# Patient Record
Sex: Female | Born: 1980 | Race: White | Hispanic: No | Marital: Married | Smoking: Never smoker
Health system: Southern US, Community
[De-identification: ages and names within clinical notes are randomized; demographics above are authoritative.]

## PROBLEM LIST (undated history)

## (undated) DIAGNOSIS — F32A Depression, unspecified: Secondary | ICD-10-CM

## (undated) DIAGNOSIS — F419 Anxiety disorder, unspecified: Secondary | ICD-10-CM

## (undated) DIAGNOSIS — J45909 Unspecified asthma, uncomplicated: Secondary | ICD-10-CM

## (undated) DIAGNOSIS — C801 Malignant (primary) neoplasm, unspecified: Secondary | ICD-10-CM

## (undated) DIAGNOSIS — G4700 Insomnia, unspecified: Secondary | ICD-10-CM

## (undated) DIAGNOSIS — T7840XA Allergy, unspecified, initial encounter: Secondary | ICD-10-CM

## (undated) DIAGNOSIS — E079 Disorder of thyroid, unspecified: Secondary | ICD-10-CM

## (undated) DIAGNOSIS — G43909 Migraine, unspecified, not intractable, without status migrainosus: Secondary | ICD-10-CM

## (undated) HISTORY — PX: STOMACH SURGERY: SHX791

## (undated) HISTORY — DX: Depression, unspecified: F32.A

## (undated) HISTORY — DX: Migraine, unspecified, not intractable, without status migrainosus: G43.909

## (undated) HISTORY — DX: Insomnia, unspecified: G47.00

## (undated) HISTORY — PX: ABDOMINAL HYSTERECTOMY: SHX81

## (undated) HISTORY — DX: Anxiety disorder, unspecified: F41.9

## (undated) HISTORY — DX: Allergy, unspecified, initial encounter: T78.40XA

## (undated) HISTORY — PX: NASAL SINUS SURGERY: SHX719

## (undated) HISTORY — DX: Unspecified asthma, uncomplicated: J45.909

## (undated) HISTORY — DX: Disorder of thyroid, unspecified: E07.9

## (undated) HISTORY — PX: THYROIDECTOMY: SHX17

---

## 2001-12-30 ENCOUNTER — Encounter: Payer: Self-pay | Admitting: Obstetrics and Gynecology

## 2001-12-30 ENCOUNTER — Inpatient Hospital Stay (HOSPITAL_COMMUNITY): Admission: AD | Admit: 2001-12-30 | Discharge: 2001-12-31 | Payer: Self-pay | Admitting: Obstetrics and Gynecology

## 2007-07-04 ENCOUNTER — Ambulatory Visit: Payer: Self-pay | Admitting: Internal Medicine

## 2011-04-09 DIAGNOSIS — E890 Postprocedural hypothyroidism: Secondary | ICD-10-CM | POA: Insufficient documentation

## 2011-12-24 DIAGNOSIS — E209 Hypoparathyroidism, unspecified: Secondary | ICD-10-CM | POA: Insufficient documentation

## 2012-09-01 DIAGNOSIS — M7989 Other specified soft tissue disorders: Secondary | ICD-10-CM | POA: Insufficient documentation

## 2017-01-23 DIAGNOSIS — J4520 Mild intermittent asthma, uncomplicated: Secondary | ICD-10-CM | POA: Insufficient documentation

## 2017-01-23 DIAGNOSIS — G43109 Migraine with aura, not intractable, without status migrainosus: Secondary | ICD-10-CM | POA: Insufficient documentation

## 2017-01-23 DIAGNOSIS — K219 Gastro-esophageal reflux disease without esophagitis: Secondary | ICD-10-CM | POA: Insufficient documentation

## 2018-01-16 ENCOUNTER — Other Ambulatory Visit: Payer: Self-pay | Admitting: Family Medicine

## 2018-01-16 DIAGNOSIS — Z1231 Encounter for screening mammogram for malignant neoplasm of breast: Secondary | ICD-10-CM

## 2018-01-28 ENCOUNTER — Ambulatory Visit
Admission: RE | Admit: 2018-01-28 | Discharge: 2018-01-28 | Disposition: A | Discharge status: Home / Self Care | Payer: BLUE CROSS/BLUE SHIELD | Source: Ambulatory Visit | Attending: Family Medicine | Admitting: Family Medicine

## 2018-01-28 ENCOUNTER — Encounter (INDEPENDENT_AMBULATORY_CARE_PROVIDER_SITE_OTHER): Payer: Self-pay

## 2018-01-28 DIAGNOSIS — Z1231 Encounter for screening mammogram for malignant neoplasm of breast: Secondary | ICD-10-CM

## 2018-01-28 HISTORY — DX: Malignant (primary) neoplasm, unspecified: C80.1

## 2018-09-29 ENCOUNTER — Other Ambulatory Visit: Payer: Self-pay | Admitting: Sports Medicine

## 2018-09-29 DIAGNOSIS — M7542 Impingement syndrome of left shoulder: Secondary | ICD-10-CM

## 2018-09-29 DIAGNOSIS — M25512 Pain in left shoulder: Secondary | ICD-10-CM

## 2018-09-29 DIAGNOSIS — G8929 Other chronic pain: Secondary | ICD-10-CM

## 2018-09-29 DIAGNOSIS — M778 Other enthesopathies, not elsewhere classified: Secondary | ICD-10-CM

## 2018-10-13 ENCOUNTER — Ambulatory Visit
Admission: RE | Admit: 2018-10-13 | Discharge: 2018-10-13 | Disposition: A | Discharge status: Home / Self Care | Payer: BC Managed Care – PPO | Source: Ambulatory Visit | Attending: Sports Medicine | Admitting: Sports Medicine

## 2018-10-13 DIAGNOSIS — M7542 Impingement syndrome of left shoulder: Secondary | ICD-10-CM | POA: Diagnosis present

## 2018-10-13 DIAGNOSIS — M25512 Pain in left shoulder: Secondary | ICD-10-CM | POA: Diagnosis not present

## 2018-10-13 DIAGNOSIS — G8929 Other chronic pain: Secondary | ICD-10-CM | POA: Diagnosis present

## 2018-10-13 DIAGNOSIS — M778 Other enthesopathies, not elsewhere classified: Secondary | ICD-10-CM

## 2018-10-13 DIAGNOSIS — M7582 Other shoulder lesions, left shoulder: Secondary | ICD-10-CM | POA: Insufficient documentation

## 2019-07-09 ENCOUNTER — Ambulatory Visit: Payer: Self-pay | Attending: Internal Medicine

## 2019-07-09 DIAGNOSIS — Z23 Encounter for immunization: Secondary | ICD-10-CM

## 2019-07-09 NOTE — Progress Notes (Signed)
   Covid-19 Vaccination Clinic  Name:  Erin Cervantes    MRN: [id number] DOB: 1980/12/24  07/09/2019  Erin Cervantes was observed post Covid-19 immunization for 15 minutes without incident. She was provided with Vaccine Information Sheet and instruction to access the V-Safe system.   Erin Cervantes was instructed to call 911 with any severe reactions post vaccine: Marland Kitchen Difficulty breathing  . Swelling of face and throat  . A fast heartbeat  . A bad rash all over body  . Dizziness and weakness   Immunizations Administered    Name Date Dose VIS Date Route   Pfizer COVID-19 Vaccine 07/09/2019  9:17 AM 0.3 mL 03/13/2019 Intramuscular   Manufacturer: [place]   Lot: [id number]   [place]: [id number]

## 2019-08-04 ENCOUNTER — Ambulatory Visit: Payer: Self-pay | Attending: Internal Medicine

## 2019-08-04 DIAGNOSIS — Z23 Encounter for immunization: Secondary | ICD-10-CM

## 2019-08-04 NOTE — Progress Notes (Signed)
   Covid-19 Vaccination Clinic  Name:  Erin Cervantes    MRN: [id number] DOB: April 11, 1980  08/04/2019  Erin Cervantes was observed post Covid-19 immunization for 15 minutes without incident. She was provided with Vaccine Information Sheet and instruction to access the V-Safe system.   Erin Cervantes was instructed to call 911 with any severe reactions post vaccine: Marland Kitchen Difficulty breathing  . Swelling of face and throat  . A fast heartbeat  . A bad rash all over body  . Dizziness and weakness   Immunizations Administered    Name Date Dose VIS Date Route   Pfizer COVID-19 Vaccine 08/04/2019  9:25 AM 0.3 mL 05/27/2018 Intramuscular   Manufacturer: [place]   Lot: [id number]   [place]: [id number]

## 2019-11-27 DIAGNOSIS — G4719 Other hypersomnia: Secondary | ICD-10-CM | POA: Insufficient documentation

## 2020-03-22 LAB — HM HEPATITIS C SCREENING LAB: HM Hepatitis Screen: NEGATIVE

## 2020-05-26 DIAGNOSIS — M25559 Pain in unspecified hip: Secondary | ICD-10-CM | POA: Insufficient documentation

## 2020-11-18 IMAGING — MR MRI OF THE LEFT SHOULDER WITHOUT CONTRAST
5 series · 32 of 40 positions shown · non-contrast
Comparison: None.

CLINICAL DATA: Persistent shoulder pain and limited range of motion
since feeling a pop while reaching for something in [REDACTED].

EXAM:
MRI OF THE LEFT SHOULDER WITHOUT CONTRAST
TECHNIQUE: Multiplanar, multisequence MR imaging of the shoulder was performed.
No intravenous contrast was administered.

[Series 5: PD fat-sat · axial · left · 4.0mm · 0.55mm/px · z∈[-53,+77]mm · 8 of 28 slices shown (1 of 2)]
[im 1/28]
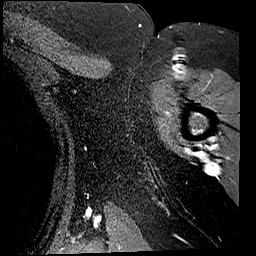
[im 4/28]
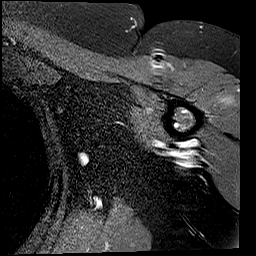
[im 10/28]
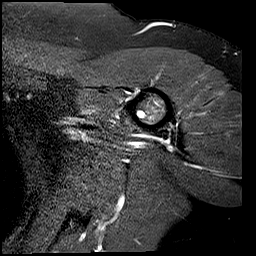
[im 13/28]
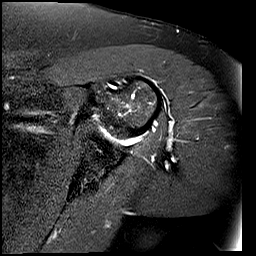
[im 16/28]
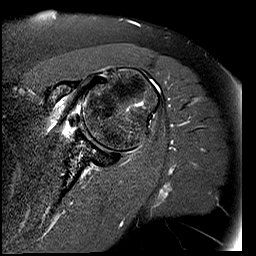
[im 19/28]
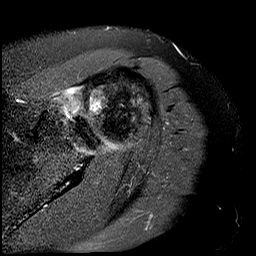
[im 25/28]
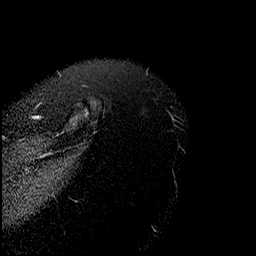
[im 28/28]
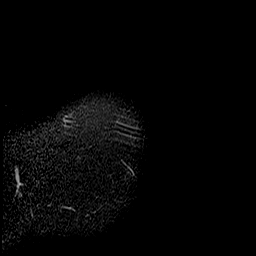

[Series 6: PD fat-sat · oblique · left · 4.0mm · 0.44mm/px · 8 of 26 slices shown (2 of 2)]
[im 1/26]
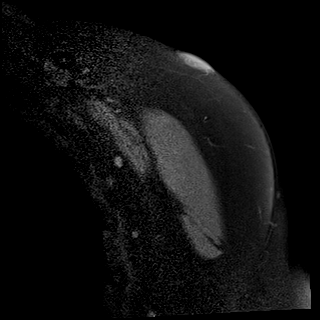
[im 4/26]
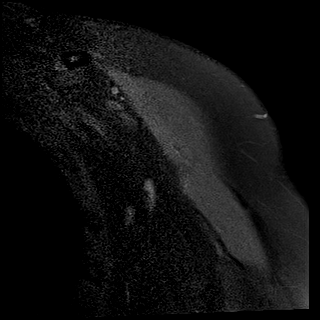
[im 8/26]
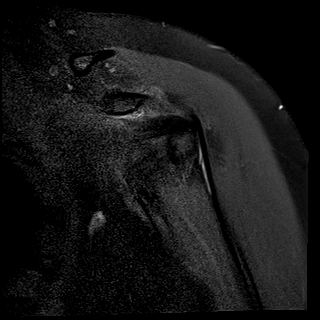
[im 11/26]
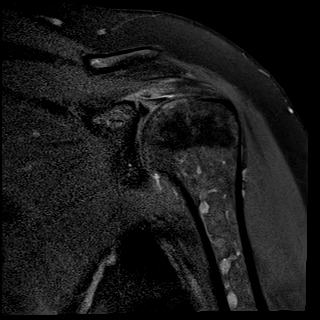
[im 15/26]
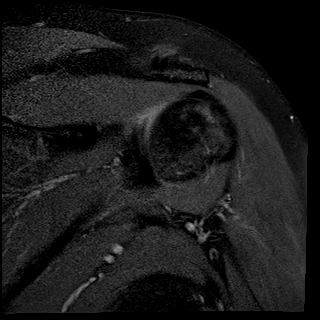
[im 18/26]
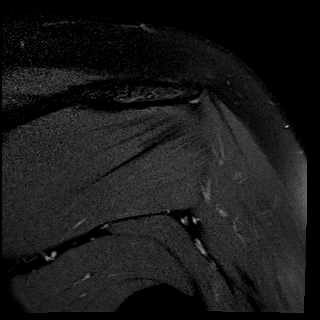
[im 22/26]
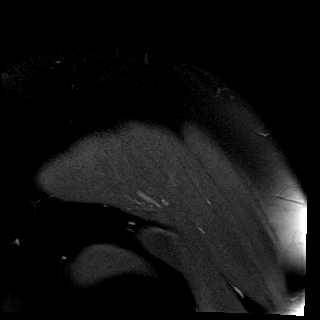
[im 26/26]
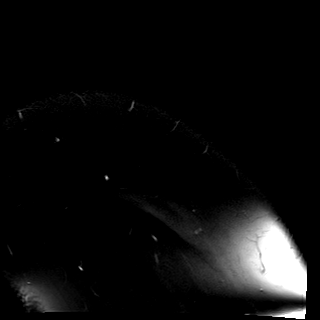

[Series 7: T2 fat-sat · oblique · left · 4.0mm · 0.44mm/px · 8 of 26 slices shown (1 of 2)]
[im 1/26]
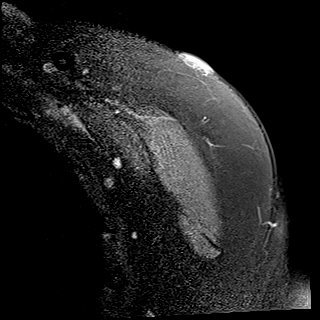
[im 4/26]
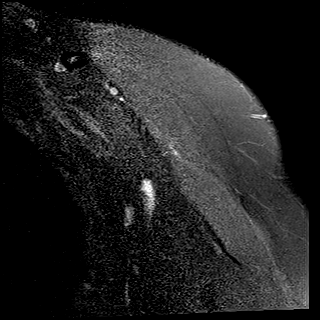
[im 8/26]
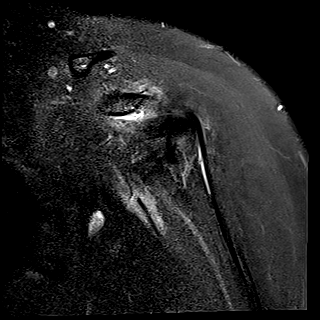
[im 11/26]
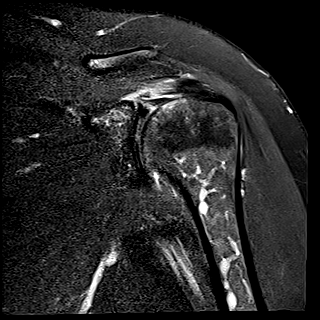
[im 15/26]
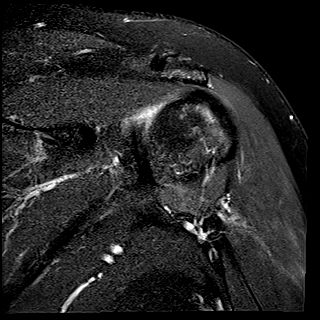
[im 18/26]
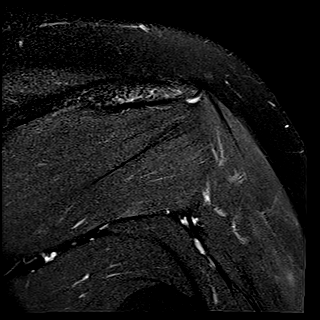
[im 22/26]
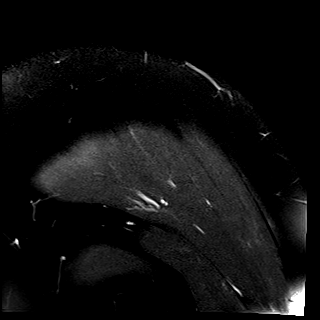
[im 26/26]
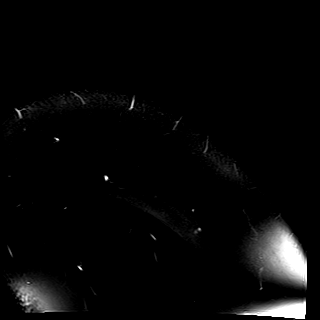

[Series 8: T2 fat-sat · oblique · left · 4.0mm · 0.23mm/px · 7 of 22 slices shown (2 of 2)]
[im 1/22]
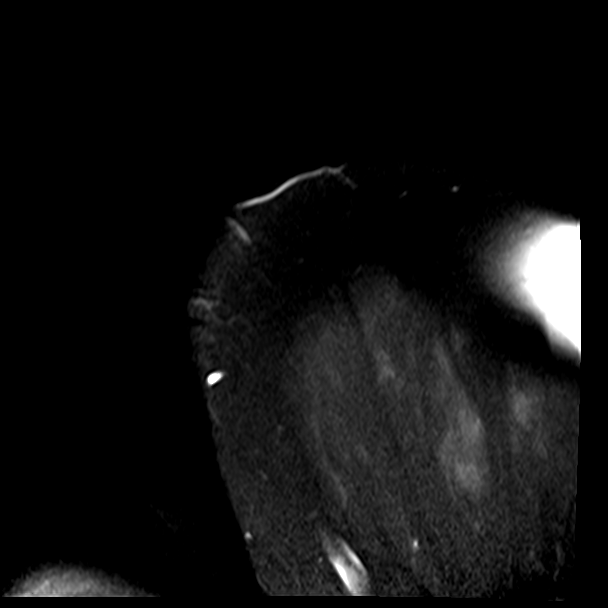
[im 4/22]
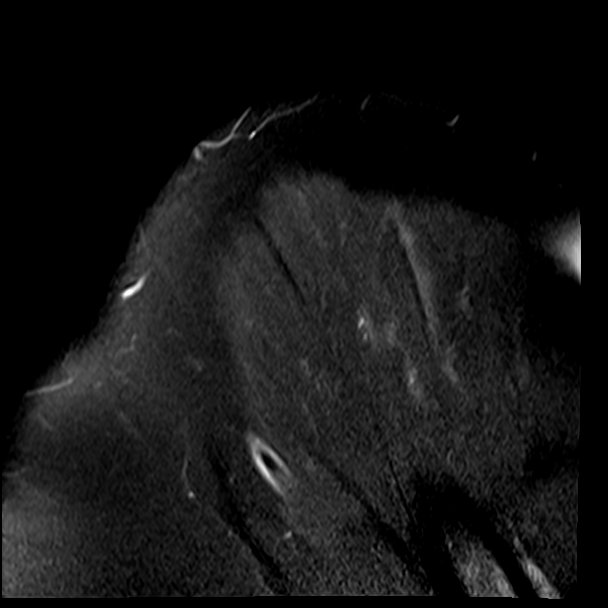
[im 8/22]
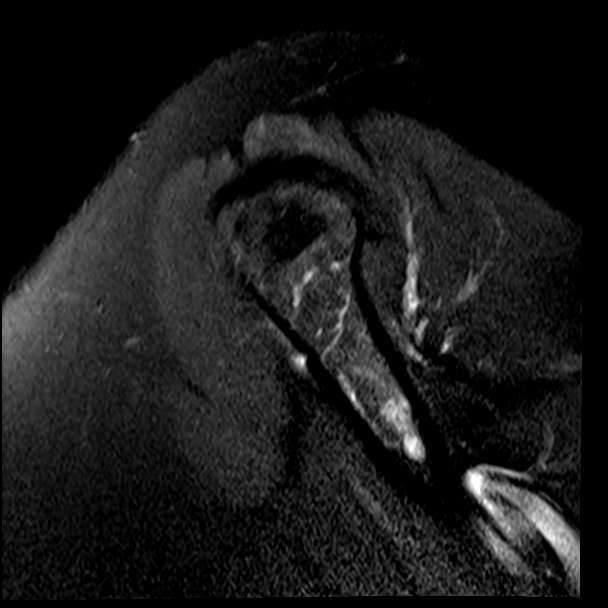
[im 11/22]
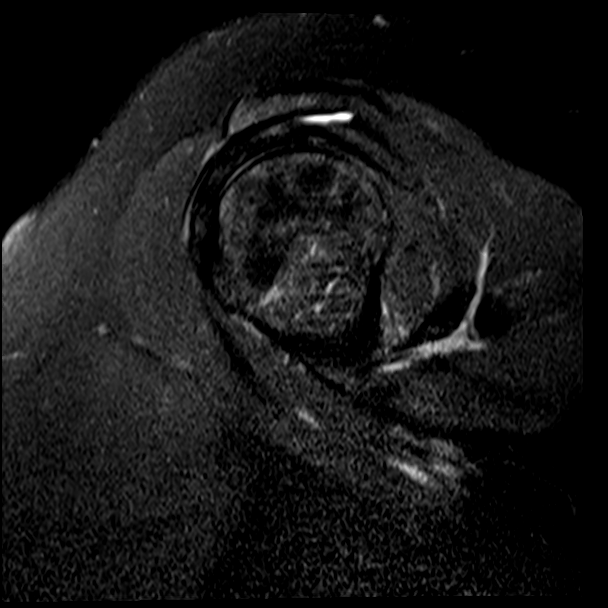
[im 15/22]
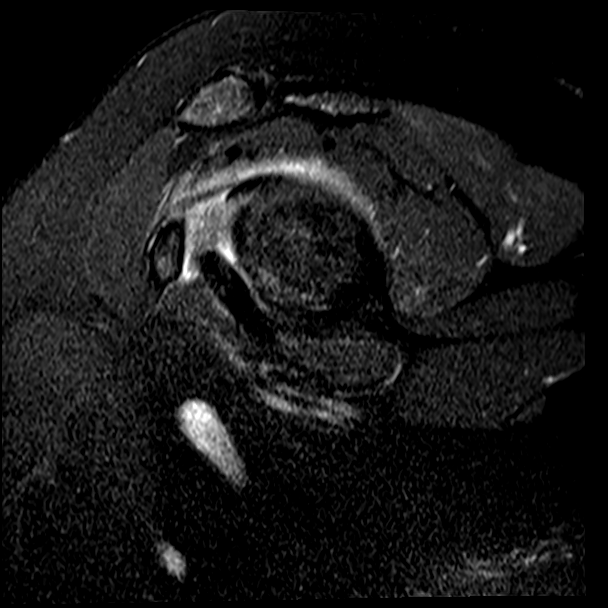
[im 18/22]
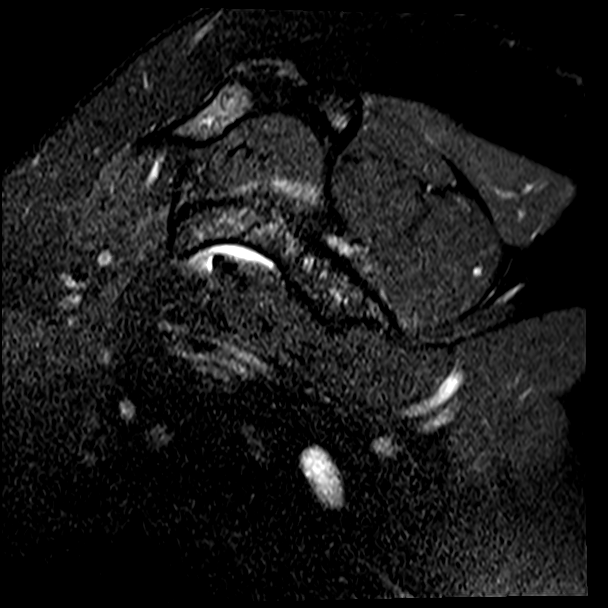
[im 22/22]
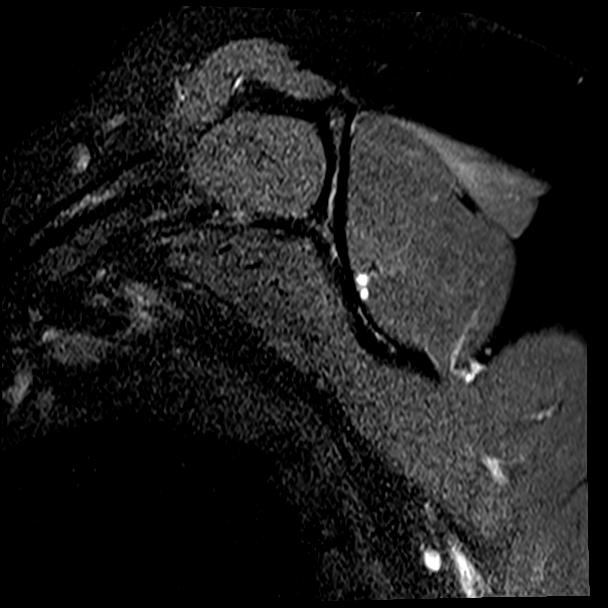

[Series 9: T1 · oblique · left · 4.0mm · 0.36mm/px · 1 of 22 slices shown]
[im 1/22]
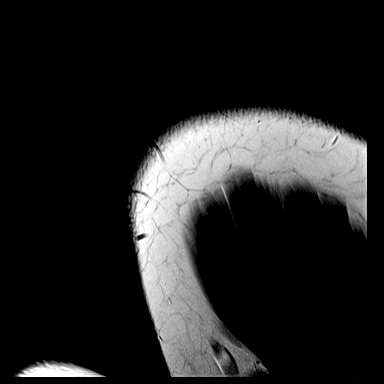

[32 of 40 positions shown; findings below may reference images not displayed]

FINDINGS: Rotator cuff: Intact supraspinatus, infraspinatus, teres minor and
subscapularis tendons.

Muscles:  No atrophy or abnormal signal of the

Muscles of the rotator cuff.

Biceps long head:  Intact and normally positioned.

Acromioclavicular Joint: Minimal arthropathy of the
acromioclavicular joint with tiny marginal osteophytes. Type II
acromion. Trace subacromial/subdeltoid bursal fluid.

Glenohumeral Joint: No joint effusion. No chondral defect.

Labrum: Grossly intact, but evaluation is limited by lack of
intraarticular fluid. Bulbous appearance of the anterior labrum
without discrete tear.

Bones: Small area of focal marrow edema in the anterior superior
humeral head. No acute fracture or dislocation. No suspicious bone
lesion.

Other: Increased T2 signal and loss of the normal fat in the rotator
interval.
IMPRESSION: 1. Small area of focal marrow edema in the anterior superior humeral
head, nonspecific, potentially degenerative or a tiny contusion. No
fracture.
2. Intact rotator cuff.
3. Very mild subacromial/subdeltoid bursitis.
4. Findings which can be seen with adhesive capsulitis.

## 2021-06-20 LAB — HM MAMMOGRAPHY

## 2022-03-15 ENCOUNTER — Encounter: Payer: Self-pay | Admitting: Internal Medicine

## 2022-03-15 ENCOUNTER — Ambulatory Visit: Payer: 59 | Admitting: Internal Medicine

## 2022-03-15 VITALS — BP 124/72 | HR 79 | Temp 97.7°F | Resp 18 | Ht 67.0 in | Wt 225.1 lb

## 2022-03-15 DIAGNOSIS — Z23 Encounter for immunization: Secondary | ICD-10-CM

## 2022-03-15 DIAGNOSIS — J302 Other seasonal allergic rhinitis: Secondary | ICD-10-CM | POA: Diagnosis not present

## 2022-03-15 DIAGNOSIS — F339 Major depressive disorder, recurrent, unspecified: Secondary | ICD-10-CM

## 2022-03-15 DIAGNOSIS — Z13220 Encounter for screening for lipoid disorders: Secondary | ICD-10-CM

## 2022-03-15 DIAGNOSIS — J4520 Mild intermittent asthma, uncomplicated: Secondary | ICD-10-CM | POA: Diagnosis not present

## 2022-03-15 DIAGNOSIS — E890 Postprocedural hypothyroidism: Secondary | ICD-10-CM

## 2022-03-15 DIAGNOSIS — G43909 Migraine, unspecified, not intractable, without status migrainosus: Secondary | ICD-10-CM | POA: Diagnosis not present

## 2022-03-15 DIAGNOSIS — Z114 Encounter for screening for human immunodeficiency virus [HIV]: Secondary | ICD-10-CM

## 2022-03-15 NOTE — Patient Instructions (Signed)
It was great seeing you today!  Plan discussed at today's visit: -Blood work ordered today, results will be uploaded to [facility].  -Flu vaccine today  Follow up in: 6 months or sooner as needed   Take care and let us know if you have any questions or concerns prior to your next visit.  Dr. Rosana Berger

## 2022-03-15 NOTE — Progress Notes (Signed)
New Patient Office Visit  Subjective    Patient ID: Erin Cervantes, female    DOB: 12/14/1980  Age: 41 y.o. MRN: [id number]  CC:  Chief Complaint  Patient presents with   Establish Care    HPI Erin Cervantes presents to establish care.  Asthma:  -Asthma status: stable -Current Treatments: Albuterol PRN -Satisfied with current treatment?: yes -Albuterol/rescue inhaler frequency: rarely - worse with allergies/viral illness  -Dyspnea frequency: occasionally  -Wheezing frequency: no -Cough frequency: no  -Nocturnal symptom frequency: none  -Limitation of activity: no -Current upper respiratory symptoms: no -Pneumovax: unknown -Influenza: Not up to Date  Hypothyroidism/Hypoparathyroidism: -Medications: Levothyroxine 175 mcg, Calcitriol 0.25  -History of goiter, s/p total thyroidectomy in 2009 -Patient is compliant with the above medication (s) at the above dose and reports no medication side effects.  -Denies weight changes, cold./heat intolerance, skin changes, anxiety/palpitations  -Last TSH: 11/22/21 2.397 -Following with Endocrinology at [facility]   MIGRAINES -Following with Neurology -Currently on Ubrelvy 100 mg, Topamax 100 mg  -Now having migraines only about once a month - duration used to be daily prior to Topamax. Roselyn Meier also working well, will abort migraines in about 30 minutes  Duration: chronic  MDD: -Following with Psychiatry at [facility]  -Mood status: stable - having a hard time working from home, feeling isolated -Current treatment: Celexa 40 mg, Trazodone 100 mg, Klonopin PRN -Duration of current treatment : years -Side effects: no Medication compliance: excellent compliance     03/15/2022    3:34 PM  Depression screen PHQ 2/9  Decreased Interest 2  Down, Depressed, Hopeless 2  PHQ - 2 Score 4  Altered sleeping 3  Tired, decreased energy 3  Change in appetite 2  Feeling bad or failure about yourself  1  Trouble concentrating 3   Moving slowly or fidgety/restless 2  Suicidal thoughts 0  PHQ-9 Score 18  Difficult doing work/chores Very difficult   Seasonal Allergies:  -Currently on Allegra  Health Maintenance: -Blood work due -Mammogram 06/20/21 Birads-1  -Flu vaccine due    Outpatient Encounter Medications as of 03/15/2022  Medication Sig   albuterol (VENTOLIN HFA) 108 (90 Base) MCG/ACT inhaler Inhale 2 puffs into the lungs every 6 (six) hours as needed.   calcitRIOL (ROCALTROL) 0.25 MCG capsule Take 0.5 mcg by mouth daily.   citalopram (CELEXA) 40 MG tablet Take 40 mg by mouth daily.   clonazePAM (KLONOPIN) 0.5 MG tablet Take 0.5 mg by mouth 3 (three) times daily.   fexofenadine (ALLEGRA) 180 MG tablet Take 180 mg by mouth daily.   levothyroxine (SYNTHROID) 175 MCG tablet Take 175 mcg by mouth daily.   topiramate (TOPAMAX) 100 MG tablet Take 100 mg by mouth daily.   traZODone (DESYREL) 100 MG tablet Take 100 mg by mouth at bedtime.   UBRELVY 100 MG TABS Take by mouth.   No facility-administered encounter medications on file as of 03/15/2022.    Past Medical History:  Diagnosis Date   Allergy    Anxiety    Asthma    Cancer ([place])    skin ca   Depression    Insomnia    Migraines    Thyroid disease     Past Surgical History:  Procedure Laterality Date   ABDOMINAL HYSTERECTOMY     CESAREAN SECTION     NASAL SINUS SURGERY     STOMACH SURGERY     THYROIDECTOMY      Family History  Problem Relation Age of Onset  Thyroid disease Mother    Bipolar disorder Daughter    Asthma Son    Breast cancer Maternal Aunt        4 mat aunts   Breast cancer Paternal Aunt    Breast cancer Paternal Grandmother     Social History   Socioeconomic History   Marital status: Married    Spouse name: Not on file   Number of children: Not on file   Years of education: Not on file   Highest education level: Not on file  Occupational History   Not on file  Tobacco Use   Smoking status: Never    Smokeless tobacco: Never  Vaping Use   Vaping Use: Never used  Substance and Sexual Activity   Alcohol use: Yes   Drug use: Never   Sexual activity: Yes  Other Topics Concern   Not on file  Social History Narrative   Not on file   Social Determinants of Health   Financial Resource Strain: Not on file  Food Insecurity: Not on file  Transportation Needs: Not on file  Physical Activity: Not on file  Stress: Not on file  Social Connections: Not on file  Intimate Partner Violence: Not on file    Review of Systems  All other systems reviewed and are negative.       Objective    BP 124/72   Pulse 79   Temp 97.7 F (36.5 C)   Resp 18   Ht '5\' 7"'$  (1.702 m)   Wt 225 lb 1.6 oz (102.1 kg)   SpO2 90%   BMI 35.26 kg/m   Physical Exam Constitutional:      Appearance: Normal appearance.  HENT:     Head: Normocephalic and atraumatic.     Mouth/Throat:     Mouth: Mucous membranes are moist.     Pharynx: Oropharynx is clear.  Eyes:     Conjunctiva/sclera: Conjunctivae normal.  Cardiovascular:     Rate and Rhythm: Normal rate and regular rhythm.  Pulmonary:     Effort: Pulmonary effort is normal.     Breath sounds: Normal breath sounds.  Musculoskeletal:     Right lower leg: No edema.     Left lower leg: No edema.  Skin:    General: Skin is warm and dry.  Neurological:     General: No focal deficit present.     Mental Status: She is alert. Mental status is at baseline.  Psychiatric:        Mood and Affect: Mood normal.        Behavior: Behavior normal.         Assessment & Plan:   1. Mild intermittent asthma without complication: Chronic, mild.  Using albuterol as needed.  2. Seasonal allergies: Stable.  Currently taking over-the-counter Allegra.  3. Postoperative hypothyroidism: Following with endocrinology, last seen 11/27/2021, note and labs reviewed.  She is currently on levothyroxine 175 mcg daily, Calcitrol 0.25 mg.  4. Migraine without status  migrainosus, not intractable, unspecified migraine type: Stable, following with neurology.  She is currently on Ubrelvy as an abortive agent and Topamax 100 mg daily for prophylaxis.  She is due for annual labs including CBC and CMP today.  - CBC w/Diff/Platelet - COMPLETE METABOLIC PANEL WITH GFR  5. Episode of recurrent major depressive disorder, unspecified depression episode severity ([place]): Following with psychiatry at [place] [facility], currently on Celexa 40 mg, trazodone 100 mg at night and Klonopin 0.5 mg as needed.  Her depression screening  was hide today, patient states she works from home and sometimes has a hard time with feeling isolated.  6. Lipid screening/ Encounter for screening for HIV: Screening labs due.  - Lipid Profile - HIV antibody (with reflex)  7. Need for influenza vaccination: Flu vaccine administered today.  - Flu Vaccine QUAD 6+ mos PF IM (Fluarix Quad PF)   Return in about 6 months (around 09/14/2022).   Teodora Medici, DO

## 2022-03-16 LAB — CBC WITH DIFFERENTIAL/PLATELET
Absolute Monocytes: 576 cells/uL (ref 200–950)
Basophils Absolute: 50 cells/uL (ref 0–200)
Basophils Relative: 0.7 %
Eosinophils Absolute: 180 cells/uL (ref 15–500)
Eosinophils Relative: 2.5 %
HCT: 41.1 % (ref 35.0–45.0)
Hemoglobin: 14.2 g/dL (ref 11.7–15.5)
Lymphs Abs: 2448 cells/uL (ref 850–3900)
MCH: 32 pg (ref 27.0–33.0)
MCHC: 34.5 g/dL (ref 32.0–36.0)
MCV: 92.6 fL (ref 80.0–100.0)
MPV: 10.3 fL (ref 7.5–12.5)
Monocytes Relative: 8 %
Neutro Abs: 3946 cells/uL (ref 1500–7800)
Neutrophils Relative %: 54.8 %
Platelets: 274 10*3/uL (ref 140–400)
RBC: 4.44 10*6/uL (ref 3.80–5.10)
RDW: 12.3 % (ref 11.0–15.0)
Total Lymphocyte: 34 %
WBC: 7.2 10*3/uL (ref 3.8–10.8)

## 2022-03-16 LAB — COMPLETE METABOLIC PANEL WITH GFR
AG Ratio: 1.6 (calc) (ref 1.0–2.5)
ALT: 13 U/L (ref 6–29)
AST: 14 U/L (ref 10–30)
Albumin: 4.1 g/dL (ref 3.6–5.1)
Alkaline phosphatase (APISO): 57 U/L (ref 31–125)
BUN/Creatinine Ratio: 12 (calc) (ref 6–22)
BUN: 13 mg/dL (ref 7–25)
CO2: 24 mmol/L (ref 20–32)
Calcium: 7.5 mg/dL — ABNORMAL LOW (ref 8.6–10.2)
Chloride: 106 mmol/L (ref 98–110)
Creat: 1.1 mg/dL — ABNORMAL HIGH (ref 0.50–0.99)
Globulin: 2.6 g/dL (calc) (ref 1.9–3.7)
Glucose, Bld: 87 mg/dL (ref 65–99)
Potassium: 3.5 mmol/L (ref 3.5–5.3)
Sodium: 140 mmol/L (ref 135–146)
Total Bilirubin: 0.6 mg/dL (ref 0.2–1.2)
Total Protein: 6.7 g/dL (ref 6.1–8.1)
eGFR: 64 mL/min/{1.73_m2} (ref >=60)

## 2022-03-16 LAB — LIPID PANEL
Cholesterol: 183 mg/dL (ref <200)
HDL: 34 mg/dL — ABNORMAL LOW (ref >=50)
LDL Cholesterol (Calc): 119 mg/dL (calc) — ABNORMAL HIGH
Non-HDL Cholesterol (Calc): 149 mg/dL (calc) — ABNORMAL HIGH (ref <130)
Total CHOL/HDL Ratio: 5.4 (calc) — ABNORMAL HIGH (ref <5.0)
Triglycerides: 188 mg/dL — ABNORMAL HIGH (ref <150)

## 2022-03-16 LAB — HIV ANTIBODY (ROUTINE TESTING W REFLEX): HIV 1&2 Ab, 4th Generation: NONREACTIVE

## 2022-04-06 ENCOUNTER — Ambulatory Visit: Payer: Self-pay | Admitting: Internal Medicine

## 2022-04-06 ENCOUNTER — Encounter: Payer: Self-pay | Admitting: Internal Medicine

## 2022-04-09 NOTE — Progress Notes (Unsigned)
   Acute Office Visit  Subjective:     Patient ID: Erin Cervantes, female    DOB: 05-29-1980, 42 y.o.   MRN: [id number]  No chief complaint on file.   HPI Patient is in today to discuss weight loss.   ROS      Objective:    There were no vitals taken for this visit. {Vitals History (Optional):23777}  Physical Exam  No results found for any visits on 04/10/22.      Assessment & Plan:   Problem List Items Addressed This Visit   None   No orders of the defined types were placed in this encounter.   No follow-ups on file.  Teodora Medici, DO

## 2022-04-10 ENCOUNTER — Ambulatory Visit: Payer: 59 | Admitting: Internal Medicine

## 2022-04-10 ENCOUNTER — Other Ambulatory Visit: Payer: Self-pay

## 2022-04-10 ENCOUNTER — Encounter: Payer: Self-pay | Admitting: Internal Medicine

## 2022-04-10 VITALS — BP 120/72 | HR 80 | Temp 98.0°F | Resp 18 | Ht 67.0 in | Wt 226.0 lb

## 2022-04-10 DIAGNOSIS — E669 Obesity, unspecified: Secondary | ICD-10-CM

## 2022-04-10 DIAGNOSIS — Z6835 Body mass index (BMI) 35.0-35.9, adult: Secondary | ICD-10-CM | POA: Diagnosis not present

## 2022-04-10 LAB — POCT GLYCOSYLATED HEMOGLOBIN (HGB A1C): Hemoglobin A1C: 5.4 % (ref 4.0–5.6)

## 2022-04-10 MED ORDER — OZEMPIC (0.25 OR 0.5 MG/DOSE) 2 MG/1.5ML ~~LOC~~ SOPN: 0.2500 mg | PEN_INJECTOR | SUBCUTANEOUS | 0 refills | Status: AC

## 2022-04-10 NOTE — Patient Instructions (Signed)
It was great seeing you today!  Plan discussed at today's visit: -Prescription for Ozempic 0.25 mg sent to pharmacy to use once a week -A1c 5.4% which is good  Follow up in: 1 month after first dose of medication  Take care and let us know if you have any questions or concerns prior to your next visit.  Dr. Rosana Berger  Semaglutide Injection What is this medication? SEMAGLUTIDE (SEM a GLOO tide) treats type 2 diabetes. It works by increasing insulin levels in your body, which decreases your blood sugar (glucose). It also reduces the amount of sugar released into the blood and slows down your digestion. It can also be used to lower the risk of heart attack and stroke in people with type 2 diabetes. Changes to diet and exercise are often combined with this medication. This medicine may be used for other purposes; ask your health care provider or pharmacist if you have questions. COMMON BRAND NAME(S): OZEMPIC What should I tell my care team before I take this medication? They need to know if you have any of these conditions: Endocrine tumors (MEN 2) or if someone in your family had these tumors Eye disease, vision problems History of pancreatitis Kidney disease Stomach problems Thyroid cancer or if someone in your family had thyroid cancer An unusual or allergic reaction to semaglutide, other medications, foods, dyes, or preservatives Pregnant or trying to get pregnant Breast-feeding How should I use this medication? This medication is for injection under the skin of your upper leg (thigh), stomach area, or upper arm. It is given once every week (every 7 days). You will be taught how to prepare and give this medication. Use exactly as directed. Take your medication at regular intervals. Do not take it more often than directed. If you use this medication with insulin, you should inject this medication and the insulin separately. Do not mix them together. Do not give the injections right next to  each other. Change (rotate) injection sites with each injection. It is important that you put your used needles and syringes in a special sharps container. Do not put them in a trash can. If you do not have a sharps container, call your pharmacist or care team to get one. A special MedGuide will be given to you by the pharmacist with each prescription and refill. Be sure to read this information carefully each time. This medication comes with INSTRUCTIONS FOR USE. Ask your pharmacist for directions on how to use this medication. Read the information carefully. Talk to your pharmacist or care team if you have questions. Talk to your care team about the use of this medication in children. Special care may be needed. Overdosage: If you think you have taken too much of this medicine contact a poison control center or emergency room at once. NOTE: This medicine is only for you. Do not share this medicine with others. What if I miss a dose? If you miss a dose, take it as soon as you can within 5 days after the missed dose. Then take your next dose at your regular weekly time. If it has been longer than 5 days after the missed dose, do not take the missed dose. Take the next dose at your regular time. Do not take double or extra doses. If you have questions about a missed dose, contact your care team for advice. What may interact with this medication? Other medications for diabetes Many medications may cause changes in blood sugar, these include: Alcohol containing  beverages Antiviral medications for HIV or AIDS Aspirin and aspirin-like medications Certain medications for blood pressure, heart disease, irregular heart beat Chromium Diuretics Female hormones, such as estrogens or progestins, birth control pills Fenofibrate Gemfibrozil Isoniazid Lanreotide Female hormones or anabolic steroids MAOIs like Carbex, Eldepryl, Marplan, Nardil, and Parnate Medications for weight loss Medications for  allergies, asthma, cold, or cough Medications for depression, anxiety, or psychotic disturbances Niacin Nicotine NSAIDs, medications for pain and inflammation, like ibuprofen or naproxen Octreotide Pasireotide Pentamidine Phenytoin Probenecid Quinolone antibiotics such as ciprofloxacin, levofloxacin, ofloxacin Some herbal dietary supplements Steroid medications such as prednisone or cortisone Sulfamethoxazole; trimethoprim Thyroid hormones Some medications can hide the warning symptoms of low blood sugar (hypoglycemia). You may need to monitor your blood sugar more closely if you are taking one of these medications. These include: Beta-blockers, often used for high blood pressure or heart problems (examples include atenolol, metoprolol, propranolol) Clonidine Guanethidine Reserpine This list may not describe all possible interactions. Give your health care provider a list of all the medicines, herbs, non-prescription drugs, or dietary supplements you use. Also tell them if you smoke, drink alcohol, or use illegal drugs. Some items may interact with your medicine. What should I watch for while using this medication? Visit your care team for regular checks on your progress. Drink plenty of fluids while taking this medication. Check with your care team if you get an attack of severe diarrhea, nausea, and vomiting. The loss of too much body fluid can make it dangerous for you to take this medication. A test called the HbA1C (A1C) will be monitored. This is a simple blood test. It measures your blood sugar control over the last 2 to 3 months. You will receive this test every 3 to 6 months. Learn how to check your blood sugar. Learn the symptoms of low and high blood sugar and how to manage them. Always carry a quick-source of sugar with you in case you have symptoms of low blood sugar. Examples include hard sugar candy or glucose tablets. Make sure others know that you can choke if you eat or  drink when you develop serious symptoms of low blood sugar, such as seizures or unconsciousness. They must get medical help at once. Tell your care team if you have high blood sugar. You might need to change the dose of your medication. If you are sick or exercising more than usual, you might need to change the dose of your medication. Do not skip meals. Ask your care team if you should avoid alcohol. Many nonprescription cough and cold products contain sugar or alcohol. These can affect blood sugar. Pens should never be shared. Even if the needle is changed, sharing may result in passing of viruses like hepatitis or HIV. Wear a medical ID bracelet or chain, and carry a card that describes your disease and details of your medication and dosage times. Do not become pregnant while taking this medication. Women should inform their care team if they wish to become pregnant or think they might be pregnant. There is a potential for serious side effects to an unborn child. Talk to your care team for more information. What side effects may I notice from receiving this medication? Side effects that you should report to your care team as soon as possible: Allergic reactions--skin rash, itching, hives, swelling of the face, lips, tongue, or throat Change in vision Dehydration--increased thirst, dry mouth, feeling faint or lightheaded, headache, dark yellow or brown urine Gallbladder problems--severe stomach pain, nausea,  vomiting, fever Heart palpitations--rapid, pounding, or irregular heartbeat Kidney injury--decrease in the amount of urine, swelling of the ankles, hands, or feet Pancreatitis--severe stomach pain that spreads to your back or gets worse after eating or when touched, fever, nausea, vomiting Thyroid cancer--new mass or lump in the neck, pain or trouble swallowing, trouble breathing, hoarseness Side effects that usually do not require medical attention (report to your care team if they continue or  are bothersome): Diarrhea Loss of appetite Nausea Stomach pain Vomiting This list may not describe all possible side effects. Call your doctor for medical advice about side effects. You may report side effects to FDA at 1-800-FDA-1088. Where should I keep my medication? Keep out of the reach of children. Store unopened pens in a refrigerator between 2 and 8 degrees C (36 and 46 degrees F). Do not freeze. Protect from light and heat. After you first use the pen, it can be stored for 56 days at room temperature between 15 and 30 degrees C (59 and 86 degrees F) or in a refrigerator. Throw away your used pen after 56 days or after the expiration date, whichever comes first. Do not store your pen with the needle attached. If the needle is left on, medication may leak from the pen. NOTE: This sheet is a summary. It may not cover all possible information. If you have questions about this medicine, talk to your doctor, pharmacist, or health care provider.  2023 Elsevier/Gold Standard (2020-06-02 00:00:00)

## 2022-04-12 ENCOUNTER — Other Ambulatory Visit: Payer: Self-pay | Admitting: Internal Medicine

## 2022-04-12 DIAGNOSIS — E669 Obesity, unspecified: Secondary | ICD-10-CM

## 2022-04-12 NOTE — Telephone Encounter (Signed)
  Pharmacy comment: Alternative Requested:PA DENIED. PLEASE SEND ALTERNATIVE.

## 2022-05-21 ENCOUNTER — Ambulatory Visit (INDEPENDENT_AMBULATORY_CARE_PROVIDER_SITE_OTHER): Payer: 59 | Admitting: Family Medicine

## 2022-05-21 ENCOUNTER — Encounter: Payer: Self-pay | Admitting: Family Medicine

## 2022-05-21 VITALS — BP 118/76 | HR 125 | Temp 99.9°F | Resp 16 | Ht 67.0 in | Wt 215.3 lb

## 2022-05-21 DIAGNOSIS — R059 Cough, unspecified: Secondary | ICD-10-CM | POA: Diagnosis not present

## 2022-05-21 DIAGNOSIS — R52 Pain, unspecified: Secondary | ICD-10-CM | POA: Diagnosis not present

## 2022-05-21 LAB — POCT INFLUENZA A/B
Influenza A, POC: POSITIVE — AB
Influenza B, POC: NEGATIVE

## 2022-05-21 MED ORDER — BENZONATATE 100 MG PO CAPS: 100.0000 mg | ORAL_CAPSULE | Freq: Three times a day (TID) | ORAL | 0 refills | Status: DC | PRN

## 2022-05-21 MED ORDER — OSELTAMIVIR PHOSPHATE 75 MG PO CAPS: 75.0000 mg | ORAL_CAPSULE | Freq: Two times a day (BID) | ORAL | 0 refills | Status: AC

## 2022-05-21 NOTE — Patient Instructions (Signed)
It was great to see you!  Our plans for today:  - You have the flu. Take the tamiflu twice daily for 5 days.  - Take the tessalon as needed for cough.  - Continue to use the albuterol as needed for shortness of breath or frequent coughing. If you have difficulties breathing and albuterol is not helping, go to the Emergency Room.   Take care and seek immediate care sooner if you develop any concerns.   Dr. Ky Barban

## 2022-05-21 NOTE — Progress Notes (Signed)
   SUBJECTIVE:   CHIEF COMPLAINT / HPI:   UPPER RESPIRATORY TRACT INFECTION - symptom onset yesterday - fever, chills, cough, body aches Fever: yes Cough: yes, nonproductive Shortness of breath:  sometimes. Using albuterol, helping Chest tightness:  sometimes Chest congestion: no Nasal congestion: no Sinus pressure: no Headache: yes Toothache: yes Vomiting: no Sick contacts: unknown Relief with OTC cold/cough medications: yes  Treatments attempted: Advil/tylenol cold/flu, albuterol, tessalon  OBJECTIVE:   BP 118/76   Pulse (!) 125   Temp 99.9 F (37.7 C) (Oral)   Resp 16   Ht 5' 7"$  (1.702 m)   Wt 215 lb 4.8 oz (97.7 kg)   SpO2 96%   BMI 33.72 kg/m   Gen: tired appearing, in NAD Card: tachycardic, reg rhythm Lungs: CTAB, no wheeze, rales. Ext: WWP, no edema   ASSESSMENT/PLAN:   INFLUENZA Flu A positive. COVID pending. Given <72 hour duration, will trial tamiflu. Continue supportive care. Return and emergency precautions discussed. Work note provided.   Myles Gip, DO

## 2022-05-22 LAB — NOVEL CORONAVIRUS, NAA: SARS-CoV-2, NAA: NOT DETECTED

## 2022-05-23 ENCOUNTER — Other Ambulatory Visit: Payer: Self-pay | Admitting: Internal Medicine

## 2022-05-23 DIAGNOSIS — R110 Nausea: Secondary | ICD-10-CM

## 2022-05-23 MED ORDER — ONDANSETRON HCL 4 MG PO TABS: 4.0000 mg | ORAL_TABLET | Freq: Three times a day (TID) | ORAL | 0 refills | Status: DC | PRN

## 2022-09-13 NOTE — Progress Notes (Signed)
Established Patient Office Visit  Subjective    Patient ID: Erin Cervantes, female    DOB: Jun 02, 1980  Age: 42 y.o. MRN: [id number]  CC:  Chief Complaint  Patient presents with   Follow-up    HPI Erin Cervantes presents to follow up on chronic medical conditions.  At last office visit we discussed weight loss medications, however her insurance denied GLP-1 coverage.  Since then patient has lost about 25 pounds with calorie restriction and increasing exercise.  Asthma:  -Asthma status: stable -Current Treatments: Albuterol PRN -Satisfied with current treatment?: yes -Albuterol/rescue inhaler frequency: rarely - worse with allergies/viral illness  -Dyspnea frequency: occasionally  -Wheezing frequency: no -Cough frequency: no  -Nocturnal symptom frequency: none  -Limitation of activity: no -Current upper respiratory symptoms: no -Pneumovax: unknown -Influenza: Not up to Date  Hypothyroidism/Hypoparathyroidism: -Medications: Levothyroxine 175 mcg, Calcitriol 0.25  -History of goiter, s/p total thyroidectomy in 2009 -Patient is compliant with the above medication (s) at the above dose and reports no medication side effects.  -Denies weight changes, cold./heat intolerance, skin changes, anxiety/palpitations  -Last TSH: 11/22/21 2.397 -Following with Endocrinology at [facility]   MIGRAINES -Following with Neurology -Currently on Ubrelvy 100 mg, Topamax 100 mg  -Now having migraines only about once a month - duration used to be daily prior to Topamax. Bernita Raisin also working well, will abort migraines in about 30 minutes  Duration: chronic  MDD: -Following with Psychiatry at [facility]  -Mood status: stable - having a hard time with work stress -Current treatment: Celexa 40 mg, Trazodone 100 mg, Klonopin PRN, added Abilify 5 mg which caused sedation so she is no longer taking it -Duration of current treatment : years -Side effects: no Medication compliance: excellent  compliance     09/14/2022    2:41 PM 05/21/2022    8:09 AM 04/10/2022   10:30 AM 03/15/2022    3:34 PM  Depression screen PHQ 2/9  Decreased Interest 1 0 1 2  Down, Depressed, Hopeless 1 0 1 2  PHQ - 2 Score 2 0 2 4  Altered sleeping 1 0 3 3  Tired, decreased energy 2 0 3 3  Change in appetite 1 0 3 2  Feeling bad or failure about yourself  1 0 1 1  Trouble concentrating 2 0 2 3  Moving slowly or fidgety/restless 0 0 2 2  Suicidal thoughts 0 0 0 0  PHQ-9 Score 9 0 16 18  Difficult doing work/chores Somewhat difficult Not difficult at all Somewhat difficult Very difficult   Seasonal Allergies:  -Currently on Allegra  Health Maintenance: -Blood work up-to-date -Mammogram 06/20/21 Birads-1, due   Outpatient Encounter Medications as of 09/14/2022  Medication Sig   albuterol (VENTOLIN HFA) 108 (90 Base) MCG/ACT inhaler Inhale 2 puffs into the lungs every 6 (six) hours as needed.   ARIPiprazole (ABILIFY) 5 MG tablet Take by mouth.   calcitRIOL (ROCALTROL) 0.25 MCG capsule Take 0.5 mcg by mouth daily.   citalopram (CELEXA) 40 MG tablet Take 40 mg by mouth daily.   clonazePAM (KLONOPIN) 0.5 MG tablet Take 0.5 mg by mouth 3 (three) times daily.   fexofenadine (ALLEGRA) 180 MG tablet Take 180 mg by mouth daily.   levothyroxine (SYNTHROID) 175 MCG tablet Take 175 mcg by mouth daily.   topiramate (TOPAMAX) 100 MG tablet Take 100 mg by mouth daily.   traZODone (DESYREL) 100 MG tablet Take 100 mg by mouth at bedtime.   UBRELVY 100 MG TABS Take by mouth.  benzonatate (TESSALON) 100 MG capsule Take 1 capsule (100 mg total) by mouth 3 (three) times daily as needed for cough. (Patient not taking: Reported on 09/14/2022)   ondansetron (ZOFRAN) 4 MG tablet Take 1 tablet (4 mg total) by mouth every 8 (eight) hours as needed for nausea or vomiting. (Patient not taking: Reported on 09/14/2022)   No facility-administered encounter medications on file as of 09/14/2022.    Past Medical History:   Diagnosis Date   Allergy    Anxiety    Asthma    Cancer (HCC)    skin ca   Depression    Insomnia    Migraines    Thyroid disease     Past Surgical History:  Procedure Laterality Date   ABDOMINAL HYSTERECTOMY     CESAREAN SECTION     NASAL SINUS SURGERY     STOMACH SURGERY     THYROIDECTOMY      Family History  Problem Relation Age of Onset   Thyroid disease Mother    Bipolar disorder Daughter    Asthma Son    Breast cancer Maternal Aunt        4 mat aunts   Breast cancer Paternal Aunt    Breast cancer Paternal Grandmother     Social History   Socioeconomic History   Marital status: Married    Spouse name: Not on file   Number of children: Not on file   Years of education: Not on file   Highest education level: Not on file  Occupational History   Not on file  Tobacco Use   Smoking status: Never   Smokeless tobacco: Never  Vaping Use   Vaping Use: Never used  Substance and Sexual Activity   Alcohol use: Yes   Drug use: Never   Sexual activity: Yes  Other Topics Concern   Not on file  Social History Narrative   Not on file   Social Determinants of Health   Financial Resource Strain: Not on file  Food Insecurity: Not on file  Transportation Needs: Not on file  Physical Activity: Not on file  Stress: Not on file  Social Connections: Not on file  Intimate Partner Violence: Not on file    Review of Systems  All other systems reviewed and are negative.       Objective    BP 112/70 (BP Location: Right Arm, Patient Position: Sitting, Cuff Size: Large)   Pulse 97   Temp 98 F (36.7 C) (Oral)   Resp 14   Ht 5\' 6"  (1.676 m) Comment: per patient  Wt 202 lb 8 oz (91.9 kg)   SpO2 97%   BMI 32.68 kg/m   Physical Exam Constitutional:      Appearance: Normal appearance.  HENT:     Head: Normocephalic and atraumatic.  Eyes:     Conjunctiva/sclera: Conjunctivae normal.  Cardiovascular:     Rate and Rhythm: Normal rate and regular rhythm.   Pulmonary:     Effort: Pulmonary effort is normal.     Breath sounds: Normal breath sounds.  Musculoskeletal:     Right lower leg: No edema.     Left lower leg: No edema.  Skin:    General: Skin is warm and dry.  Neurological:     General: No focal deficit present.     Mental Status: She is alert. Mental status is at baseline.  Psychiatric:        Mood and Affect: Mood normal.  Behavior: Behavior normal.         Assessment & Plan:   1. Encounter for screening mammogram for malignant neoplasm of breast: Mammogram ordered.   - MM 3D SCREENING MAMMOGRAM BILATERAL BREAST; Future  2. Class 1 obesity without serious comorbidity with body mass index (BMI) of 32.0 to 32.9 in adult, unspecified obesity type: Patient lost 25 pounds with lifestyle change, efforts congratulated.    Return in about 6 months (around 03/16/2023).   Margarita Mail, DO

## 2022-09-14 ENCOUNTER — Ambulatory Visit: Payer: 59 | Admitting: Internal Medicine

## 2022-09-14 ENCOUNTER — Encounter: Payer: Self-pay | Admitting: Internal Medicine

## 2022-09-14 VITALS — BP 112/70 | HR 97 | Temp 98.0°F | Resp 14 | Ht 66.0 in | Wt 202.5 lb

## 2022-09-14 DIAGNOSIS — E669 Obesity, unspecified: Secondary | ICD-10-CM | POA: Diagnosis not present

## 2022-09-14 DIAGNOSIS — Z1231 Encounter for screening mammogram for malignant neoplasm of breast: Secondary | ICD-10-CM

## 2022-09-14 DIAGNOSIS — Z6832 Body mass index (BMI) 32.0-32.9, adult: Secondary | ICD-10-CM

## 2022-09-14 DIAGNOSIS — M719 Bursopathy, unspecified: Secondary | ICD-10-CM | POA: Insufficient documentation

## 2023-01-09 ENCOUNTER — Ambulatory Visit
Admission: RE | Admit: 2023-01-09 | Discharge: 2023-01-09 | Disposition: A | Discharge status: Home / Self Care | Payer: 59 | Source: Ambulatory Visit | Attending: Internal Medicine | Admitting: Internal Medicine

## 2023-01-09 DIAGNOSIS — Z1231 Encounter for screening mammogram for malignant neoplasm of breast: Secondary | ICD-10-CM | POA: Insufficient documentation

## 2023-01-10 ENCOUNTER — Inpatient Hospital Stay
Admission: RE | Admit: 2023-01-10 | Discharge: 2023-01-10 | Disposition: A | Discharge status: Home / Self Care | Payer: Self-pay | Source: Ambulatory Visit | Attending: Internal Medicine | Admitting: Internal Medicine

## 2023-01-10 ENCOUNTER — Other Ambulatory Visit: Payer: Self-pay | Admitting: *Deleted

## 2023-01-10 DIAGNOSIS — Z1231 Encounter for screening mammogram for malignant neoplasm of breast: Secondary | ICD-10-CM

## 2023-03-18 NOTE — Progress Notes (Signed)
Established Patient Office Visit  Subjective   Patient ID: Erin Cervantes, female    DOB: [birth year]  Age: 42 y.o. MRN: [id number]  Chief Complaint  Patient presents with   Medical Management of Chronic Issues    6 month recheck    HPI  Patient is here for follow up on chronic medical conditions. Patient has been having severe diarrhea for the last several months. She had been on a milk-based protein shake regimen for several months to help with weight loss, however she started having episodes of diarrhea about 10-15 times a day, now up to 15-20 times a day with severe urgency and a few episodes of incontinence. Patient stopped the protein shakes but still has the diarrhea, it will happen within 30 minutes of eating usually and she will see undigested food in the stools. Stools are completely liquid and watery. No blood. Will have LLQ pain/cramping before and after BM. No fevers, no recent antibiotic use. Patient is concerned about a food allergy.   Asthma:  -Asthma status: stable -Current Treatments: Albuterol PRN -Satisfied with current treatment?: yes -Albuterol/rescue inhaler frequency: rarely - worse with allergies/viral illness  -Dyspnea frequency: occasionally  -Wheezing frequency: no -Cough frequency: no  -Nocturnal symptom frequency: none  -Limitation of activity: no -Current upper respiratory symptoms: no -Pneumovax: unknown -Influenza: Not up to Date  Hypothyroidism/Hypoparathyroidism: -Medications: Levothyroxine 175 mcg, Calcitriol 0.25  -History of goiter, s/p total thyroidectomy in 2009 -Patient is compliant with the above medication (s) at the above dose and reports no medication side effects.  -Denies weight changes, cold./heat intolerance, skin changes, anxiety/palpitations  -Last TSH: 8/24 0.578 -Following with Endocrinology at [facility]   MIGRAINES -Following with Neurology -Currently on Ubrelvy 100 mg, Topamax 100 mg  -Now having migraines only about once  a month - duration used to be daily prior to Topamax. Bernita Raisin also working well, will abort migraines in about 30 minutes  Duration: chronic  MDD: -Following with [facility]iatry at [facility]  -Mood status: stable - having a hard time with work stress -Current treatment: Celexa 40 mg, Trazodone 100 mg, Klonopin PRN, added Abilify 5 mg which caused sedation so she is no longer taking it -Duration of current treatment : years -Side effects: no Medication compliance: excellent compliance     03/19/2023    8:50 AM 09/14/2022    2:41 PM 05/21/2022    8:09 AM 04/10/2022   10:30 AM 03/15/2022    3:34 PM  Depression screen PHQ 2/9  Decreased Interest 1 1 0 1 2  Down, Depressed, Hopeless 1 1 0 1 2  PHQ - 2 Score 2 2 0 2 4  Altered sleeping 1 1 0 3 3  Tired, decreased energy 1 2 0 3 3  Change in appetite 1 1 0 3 2  Feeling bad or failure about yourself  1 1 0 1 1  Trouble concentrating 1 2 0 2 3  Moving slowly or fidgety/restless 1 0 0 2 2  Suicidal thoughts  0 0 0 0  PHQ-9 Score 8 9 0 16 18  Difficult doing work/chores Not difficult at all Somewhat difficult Not difficult at all Somewhat difficult Very difficult    Seasonal Allergies:  -Currently on Allegra  Health Maintenance: -Blood work due -Mammogram 06/20/21 Birads-1, due  Patient Active Problem List   Diagnosis Date Noted   Chronic diarrhea 03/19/2023   Disorder of bursae of shoulder region 09/14/2022   Hip pain 05/26/2020   Excessive daytime sleepiness 11/27/2019  Migraine with aura and without status migrainosus, not intractable 01/23/2017   Mild intermittent asthma without complication 01/23/2017   Gastroesophageal reflux disease without esophagitis 01/23/2017   Leg swelling 09/01/2012   Hypoparathyroidism (HCC) 12/24/2011   Postprocedural hypothyroidism 04/09/2011   Past Medical History:  Diagnosis Date   Allergy    Anxiety    Asthma    Cancer (HCC)    skin ca   Depression    Insomnia    Migraines    Thyroid  disease    Past Surgical History:  Procedure Laterality Date   ABDOMINAL HYSTERECTOMY     CESAREAN SECTION     NASAL SINUS SURGERY     STOMACH SURGERY     THYROIDECTOMY     Social History   Tobacco Use   Smoking status: Never   Smokeless tobacco: Never  Vaping Use   Vaping status: Never Used  Substance Use Topics   Alcohol use: Yes   Drug use: Never   Social History   Socioeconomic History   Marital status: Married    Spouse name: Not on file   Number of children: Not on file   Years of education: Not on file   Highest education level: Associate degree: occupational, Scientist, product/process development, or vocational program  Occupational History   Not on file  Tobacco Use   Smoking status: Never   Smokeless tobacco: Never  Vaping Use   Vaping status: Never Used  Substance and Sexual Activity   Alcohol use: Yes   Drug use: Never   Sexual activity: Yes  Other Topics Concern   Not on file  Social History Narrative   Not on file   Social Drivers of Health   Financial Resource Strain: Low Risk  (03/15/2023)   Overall Financial Resource Strain (CARDIA)    Difficulty of Paying Living Expenses: Not hard at all  Food Insecurity: No Food Insecurity (03/15/2023)   Hunger Vital Sign    Worried About Running Out of Food in the Last Year: Never true    Ran Out of Food in the Last Year: Never true  Transportation Needs: No Transportation Needs (03/15/2023)   PRAPARE - Administrator, Civil Service (Medical): No    Lack of Transportation (Non-Medical): No  Physical Activity: Sufficiently Active (03/15/2023)   Exercise Vital Sign    Days of Exercise per Week: 5 days    Minutes of Exercise per Session: 60 min  Stress: No Stress Concern Present (03/15/2023)   [employer] of Occupational Health - Occupational Stress Questionnaire    Feeling of Stress : Only a little  Social Connections: Socially Isolated (03/15/2023)   Social Connection and Isolation Panel [NHANES]    Frequency  of Communication with Friends and Family: Once a week    Frequency of Social Gatherings with Friends and Family: Once a week    Attends Religious Services: Never    Database administrator or Organizations: No    Attends Engineer, structural: Not on file    Marital Status: Married  Catering manager Violence: Not on file   Family Status  Relation Name Status   Mother  Alive   Father  Deceased   Sister 1 Alive   Daughter 1 Alive   Son 2 Alive   Youth worker  (Not Specified)   Oceanographer  Alive   PGM  Deceased  No partnership data on file   Family History  Problem Relation Age of Onset   Thyroid disease  Mother    Bipolar disorder Daughter    Asthma Son    Breast cancer Maternal Aunt        4 mat aunts   Breast cancer Paternal Aunt    Breast cancer Paternal Grandmother    Allergies  Allergen Reactions   Meperidine Hives and Other (See Comments)    hives   Penicillins Nausea Only and Other (See Comments)    hives   Sulfa Antibiotics Other (See Comments)    hives      Review of Systems  Constitutional:  Negative for chills and fever.  Gastrointestinal:  Positive for abdominal pain and diarrhea. Negative for blood in stool, nausea and vomiting.      Objective:     BP 120/78   Pulse 76   Temp 98 F (36.7 C) (Oral)   Resp 16   Ht 5\' 7"  (1.702 m)   Wt 199 lb 9.6 oz (90.5 kg)   SpO2 99%   BMI 31.26 kg/m  BP Readings from Last 3 Encounters:  03/19/23 120/78  09/14/22 112/70  05/21/22 118/76   Wt Readings from Last 3 Encounters:  03/19/23 199 lb 9.6 oz (90.5 kg)  09/14/22 202 lb 8 oz (91.9 kg)  05/21/22 215 lb 4.8 oz (97.7 kg)      Physical Exam Constitutional:      Appearance: Normal appearance.  HENT:     Head: Normocephalic and atraumatic.  Eyes:     Conjunctiva/sclera: Conjunctivae normal.  Cardiovascular:     Rate and Rhythm: Normal rate and regular rhythm.  Pulmonary:     Effort: Pulmonary effort is normal.     Breath sounds: Normal breath  sounds.  Abdominal:     General: Bowel sounds are normal. There is no distension.     Palpations: Abdomen is soft. There is no mass.     Tenderness: There is no abdominal tenderness. There is no guarding or rebound.     Hernia: No hernia is present.  Skin:    General: Skin is warm and dry.  Neurological:     General: No focal deficit present.     Mental Status: She is alert. Mental status is at baseline.  Psychiatric:        Mood and Affect: Mood normal.        Behavior: Behavior normal.      No results found for any visits on 03/19/23.  Last CBC Lab Results  Component Value Date   WBC 7.2 03/15/2022   HGB 14.2 03/15/2022   HCT 41.1 03/15/2022   MCV 92.6 03/15/2022   MCH 32.0 03/15/2022   RDW 12.3 03/15/2022   PLT 274 03/15/2022   Last metabolic panel Lab Results  Component Value Date   GLUCOSE 87 03/15/2022   NA 140 03/15/2022   K 3.5 03/15/2022   CL 106 03/15/2022   CO2 24 03/15/2022   BUN 13 03/15/2022   CREATININE 1.10 (H) 03/15/2022   EGFR 64 03/15/2022   CALCIUM 7.5 (L) 03/15/2022   PROT 6.7 03/15/2022   BILITOT 0.6 03/15/2022   AST 14 03/15/2022   ALT 13 03/15/2022   Last lipids Lab Results  Component Value Date   CHOL 183 03/15/2022   HDL 34 (L) 03/15/2022   LDLCALC 119 (H) 03/15/2022   TRIG 188 (H) 03/15/2022   CHOLHDL 5.4 (H) 03/15/2022   Last hemoglobin A1c Lab Results  Component Value Date   HGBA1C 5.4 04/10/2022   Last thyroid functions No results found for: "  TSH", "T3TOTAL", "T4TOTAL", "THYROIDAB" Last vitamin D No results found for: "25OHVITD2", "25OHVITD3", "VD25OH" Last vitamin B12 and Folate No results found for: "VITAMINB12", "FOLATE"    The 10-year ASCVD risk score (Arnett DK, et al., 2019) is: 1.2%    Assessment & Plan:  Chronic diarrhea Assessment & Plan: Will order stool cultures, O&P to rule out infection. Will also check procalcitonin although inflammatory bowel disease less likely. Symptoms most likely due to  lactose intolerance or another food allergy versus IBS. Will check food allergy panel and rule out celiac's disease as well. Discussed starting probiotics daily and increasing dietary fiber as well. Briefly discussed Xifaxan. Will follow up in 1 month for recheck.   Orders: -     CBC with Differential/Platelet -     COMPLETE METABOLIC PANEL WITH GFR -     Ova and parasite examination -     Food Allergy Profile -     Celiac Disease Panel -     CALPROTECTIN -     Salmonella/Shigella Cult, Campy EIA and Shiga Toxin reflex  Lipid screening -     Lipid panel     Return in about 4 weeks (around 04/16/2023).    Margarita Mail, DO

## 2023-03-19 ENCOUNTER — Ambulatory Visit: Payer: 59 | Admitting: Internal Medicine

## 2023-03-19 ENCOUNTER — Encounter: Payer: Self-pay | Admitting: Internal Medicine

## 2023-03-19 ENCOUNTER — Other Ambulatory Visit: Payer: Self-pay

## 2023-03-19 VITALS — BP 120/78 | HR 76 | Temp 98.0°F | Resp 16 | Ht 67.0 in | Wt 199.6 lb

## 2023-03-19 DIAGNOSIS — K529 Noninfective gastroenteritis and colitis, unspecified: Secondary | ICD-10-CM | POA: Insufficient documentation

## 2023-03-19 DIAGNOSIS — Z13220 Encounter for screening for lipoid disorders: Secondary | ICD-10-CM | POA: Diagnosis not present

## 2023-03-19 DIAGNOSIS — R197 Diarrhea, unspecified: Secondary | ICD-10-CM | POA: Insufficient documentation

## 2023-03-19 NOTE — Assessment & Plan Note (Signed)
Will order stool cultures, O&P to rule out infection. Will also check procalcitonin although inflammatory bowel disease less likely. Symptoms most likely due to lactose intolerance or another food allergy versus IBS. Will check food allergy panel and rule out celiac's disease as well. Discussed starting probiotics daily and increasing dietary fiber as well. Briefly discussed Xifaxan. Will follow up in 1 month for recheck.

## 2023-03-19 NOTE — Patient Instructions (Addendum)
It was great seeing you today!  Plan discussed at today's visit: -Blood work and stool tests ordered today, results will be uploaded to MyChart.  -Start Align probiotics and start a fiber supplement but start low and increase slowly   Follow up in: 1 month   Take care and let us know if you have any questions or concerns prior to your next visit.  Dr. Caralee Ates

## 2023-03-20 LAB — COMPLETE METABOLIC PANEL WITH GFR
AG Ratio: 1.7 (calc) (ref 1.0–2.5)
ALT: 13 U/L (ref 6–29)
AST: 14 U/L (ref 10–30)
Albumin: 4.3 g/dL (ref 3.6–5.1)
Alkaline phosphatase (APISO): 46 U/L (ref 31–125)
BUN: 12 mg/dL (ref 7–25)
CO2: 23 mmol/L (ref 20–32)
Calcium: 8.2 mg/dL — ABNORMAL LOW (ref 8.6–10.2)
Chloride: 108 mmol/L (ref 98–110)
Creat: 0.94 mg/dL (ref 0.50–0.99)
Globulin: 2.5 g/dL (ref 1.9–3.7)
Glucose, Bld: 83 mg/dL (ref 65–99)
Potassium: 3.7 mmol/L (ref 3.5–5.3)
Sodium: 138 mmol/L (ref 135–146)
Total Bilirubin: 1.1 mg/dL (ref 0.2–1.2)
Total Protein: 6.8 g/dL (ref 6.1–8.1)
eGFR: 77 mL/min/{1.73_m2} (ref >=60)

## 2023-03-20 LAB — FOOD ALLERGY PROFILE
Allergen, Salmon, f41: <0.1 kU/L
Almonds: <0.1 kU/L
CLASS: 0
CLASS: 0
CLASS: 0
CLASS: 0
CLASS: 0
CLASS: 0
CLASS: 0
CLASS: 0
CLASS: 0
CLASS: 0
CLASS: 0
Cashew IgE: <0.1 kU/L
Class: 0
Class: 0
Class: 0
Class: 0
Egg White IgE: <0.1 kU/L
Fish Cod: <0.1 kU/L
Hazelnut: <0.1 kU/L
Milk IgE: <0.1 kU/L
Peanut IgE: <0.1 kU/L
Scallop IgE: <0.1 kU/L
Sesame Seed f10: <0.1 kU/L
Shrimp IgE: <0.1 kU/L
Soybean IgE: <0.1 kU/L
Tuna IgE: <0.1 kU/L
Walnut: <0.1 kU/L
Wheat IgE: <0.1 kU/L

## 2023-03-20 LAB — LIPID PANEL
Cholesterol: 194 mg/dL (ref <200)
HDL: 49 mg/dL — ABNORMAL LOW (ref >=50)
LDL Cholesterol (Calc): 128 mg/dL — ABNORMAL HIGH
Non-HDL Cholesterol (Calc): 145 mg/dL — ABNORMAL HIGH (ref <130)
Total CHOL/HDL Ratio: 4 (calc) (ref <5.0)
Triglycerides: 77 mg/dL (ref <150)

## 2023-03-20 LAB — CBC WITH DIFFERENTIAL/PLATELET
Absolute Lymphocytes: 2085 {cells}/uL (ref 850–3900)
Absolute Monocytes: 528 {cells}/uL (ref 200–950)
Basophils Absolute: 39 {cells}/uL (ref 0–200)
Basophils Relative: 0.7 %
Eosinophils Absolute: 88 {cells}/uL (ref 15–500)
Eosinophils Relative: 1.6 %
HCT: 43.5 % (ref 35.0–45.0)
Hemoglobin: 14.6 g/dL (ref 11.7–15.5)
MCH: 32.7 pg (ref 27.0–33.0)
MCHC: 33.6 g/dL (ref 32.0–36.0)
MCV: 97.3 fL (ref 80.0–100.0)
MPV: 9.9 fL (ref 7.5–12.5)
Monocytes Relative: 9.6 %
Neutro Abs: 2761 {cells}/uL (ref 1500–7800)
Neutrophils Relative %: 50.2 %
Platelets: 237 10*3/uL (ref 140–400)
RBC: 4.47 10*6/uL (ref 3.80–5.10)
RDW: 12 % (ref 11.0–15.0)
Total Lymphocyte: 37.9 %
WBC: 5.5 10*3/uL (ref 3.8–10.8)

## 2023-03-20 LAB — CELIAC DISEASE PANEL
(tTG) Ab, IgA: <1 U/mL
(tTG) Ab, IgG: <1 U/mL
Gliadin IgA: <1 U/mL
Gliadin IgG: <1 U/mL
Immunoglobulin A: 495 mg/dL — ABNORMAL HIGH (ref 47–310)

## 2023-03-20 LAB — INTERPRETATION:

## 2023-03-29 LAB — OVA AND PARASITE EXAMINATION
CONCENTRATE RESULT:: NONE SEEN
MICRO NUMBER:: 15877620
SPECIMEN QUALITY:: ADEQUATE
TRICHROME RESULT:: NONE SEEN

## 2023-03-29 LAB — CALPROTECTIN: Calprotectin: 155 ug/g — ABNORMAL HIGH

## 2023-03-29 LAB — SALMONELLA/SHIGELLA CULT, CAMPY EIA AND SHIGA TOXIN RFL ECOLI
MICRO NUMBER: 15881056
MICRO NUMBER:: 15881057
MICRO NUMBER:: 15881058
Result:: NOT DETECTED
SHIGA RESULT:: NOT DETECTED
SPECIMEN QUALITY: ADEQUATE
SPECIMEN QUALITY:: ADEQUATE
SPECIMEN QUALITY:: ADEQUATE

## 2023-04-02 ENCOUNTER — Other Ambulatory Visit: Payer: Self-pay | Admitting: Internal Medicine

## 2023-04-02 ENCOUNTER — Encounter: Payer: Self-pay | Admitting: Internal Medicine

## 2023-04-02 DIAGNOSIS — K529 Noninfective gastroenteritis and colitis, unspecified: Secondary | ICD-10-CM

## 2023-04-02 DIAGNOSIS — R195 Other fecal abnormalities: Secondary | ICD-10-CM

## 2023-04-16 ENCOUNTER — Encounter: Payer: Self-pay | Admitting: Internal Medicine

## 2023-04-16 ENCOUNTER — Other Ambulatory Visit: Payer: Self-pay

## 2023-04-16 ENCOUNTER — Ambulatory Visit: Payer: 59 | Admitting: Internal Medicine

## 2023-04-16 VITALS — BP 112/72 | HR 82 | Temp 98.2°F | Resp 16 | Ht 67.0 in | Wt 202.6 lb

## 2023-04-16 DIAGNOSIS — K529 Noninfective gastroenteritis and colitis, unspecified: Secondary | ICD-10-CM

## 2023-04-16 NOTE — Progress Notes (Signed)
 Established Patient Office Visit  Subjective   Patient ID: Erin Cervantes, female    DOB: [birth year]  Age: 43 y.o. MRN: [id number]  Chief Complaint  Patient presents with   Follow-up    4 week recheck    HPI  Patient is here for follow up on chronic diarrhea. Discussed at [facility] about 1 month ago - had been having severe diarrhea since the fall after she starting drinking a specific protein shake to help with weight loss. Despite discontining the shake, she is still having diarrhea multiple times a day with severe urgency. Symptoms will usually occur within 30 minutes of eating usually and she will see undigested food in the stools. Stools are completely liquid and watery. No blood. Will have LLQ pain/cramping before and after BM. No fevers, no recent antibiotic use. Last month she was worked up for infectious cause that was negative, food allergy  panel negative and celiac panel with gluten intolerance but true allergy . She has tried to cut down on gluten but has not seen a change in symptoms. The only thing that has helped slightly is the fiber supplement and probiotic. Her fecal cal-protectin was mildly elevated and she does have an appointment with GI on 05/09/23.  Patient Active Problem List   Diagnosis Date Noted   Chronic diarrhea 03/19/2023   Disorder of bursae of shoulder region 09/14/2022   Hip pain 05/26/2020   Excessive daytime sleepiness 11/27/2019   Migraine with aura and without status migrainosus, not intractable 01/23/2017   Mild intermittent asthma without complication 01/23/2017   Gastroesophageal reflux disease without esophagitis 01/23/2017   Leg swelling 09/01/2012   Hypoparathyroidism (HCC) 12/24/2011   Postprocedural hypothyroidism 04/09/2011   Past Medical History:  Diagnosis Date   Allergy     Anxiety    Asthma    Cancer (HCC)    skin ca   Depression    Insomnia    Migraines    Thyroid disease    Past Surgical History:  Procedure Laterality Date    ABDOMINAL HYSTERECTOMY     CESAREAN SECTION     NASAL SINUS SURGERY     STOMACH SURGERY     THYROIDECTOMY     Social History   Tobacco Use   Smoking status: Never   Smokeless tobacco: Never  Vaping Use   Vaping status: Never Used  Substance Use Topics   Alcohol use: Yes   Drug use: Never   Social History   Socioeconomic History   Marital status: Married    Spouse name: Not on file   Number of children: Not on file   Years of education: Not on file   Highest education level: Associate degree: occupational, scientist, product/process development, or vocational program  Occupational History   Not on file  Tobacco Use   Smoking status: Never   Smokeless tobacco: Never  Vaping Use   Vaping status: Never Used  Substance and Sexual Activity   Alcohol use: Yes   Drug use: Never   Sexual activity: Yes  Other Topics Concern   Not on file  Social History Narrative   Not on file   Social Drivers of Health   Financial Resource Strain: Low Risk  (03/15/2023)   Overall Financial Resource Strain (CARDIA)    Difficulty of Paying Living Expenses: Not hard at all  Food Insecurity: No Food Insecurity (03/15/2023)   Hunger Vital Sign    Worried About Running Out of Food in the Last Year: Never true    Ran Out  of Food in the Last Year: Never true  Transportation Needs: No Transportation Needs (03/15/2023)   PRAPARE - Administrator, Civil Service (Medical): No    Lack of Transportation (Non-Medical): No  Physical Activity: Sufficiently Active (03/15/2023)   Exercise Vital Sign    Days of Exercise per Week: 5 days    Minutes of Exercise per Session: 60 min  Stress: No Stress Concern Present (03/15/2023)   [employer] of Occupational Health - Occupational Stress Questionnaire    Feeling of Stress : Only a little  Social Connections: Socially Isolated (03/15/2023)   Social Connection and Isolation Panel [NHANES]    Frequency of Communication with Friends and Family: Once a week     Frequency of Social Gatherings with Friends and Family: Once a week    Attends Religious Services: Never    Database Administrator or Organizations: No    Attends Engineer, Structural: Not on file    Marital Status: Married  Catering Manager Violence: Not on file   Family Status  Relation Name Status   Mother  Alive   Father  Deceased   Sister 1 Alive   Daughter 1 Alive   Son 2 Chemical Engineer  (Not Specified)   Oceanographer  Alive   PGM  Deceased  No partnership data on file   Family History  Problem Relation Age of Onset   Thyroid disease Mother    Bipolar disorder Daughter    Asthma Son    Breast cancer Maternal Aunt        4 mat aunts   Breast cancer Paternal Aunt    Breast cancer Paternal Grandmother    Allergies  Allergen Reactions   Meperidine Hives and Other (See Comments)    hives   Penicillins Nausea Only and Other (See Comments)    hives   Sulfa Antibiotics Other (See Comments)    hives      Review of Systems  Constitutional:  Negative for chills and fever.  Gastrointestinal:  Positive for diarrhea. Negative for abdominal pain, blood in stool, melena, nausea and vomiting.      Objective:     BP 112/72 (Cuff Size: Large)   Pulse 82   Temp 98.2 F (36.8 C) (Oral)   Resp 16   Ht 5' 7 (1.702 m)   Wt 202 lb 9.6 oz (91.9 kg)   SpO2 98%   BMI 31.73 kg/m  BP Readings from Last 3 Encounters:  04/16/23 112/72  03/19/23 120/78  09/14/22 112/70   Wt Readings from Last 3 Encounters:  04/16/23 202 lb 9.6 oz (91.9 kg)  03/19/23 199 lb 9.6 oz (90.5 kg)  09/14/22 202 lb 8 oz (91.9 kg)      Physical Exam Constitutional:      Appearance: Normal appearance.  HENT:     Head: Normocephalic and atraumatic.  Eyes:     Conjunctiva/sclera: Conjunctivae normal.  Cardiovascular:     Rate and Rhythm: Normal rate and regular rhythm.  Pulmonary:     Effort: Pulmonary effort is normal.     Breath sounds: Normal breath sounds.  Skin:    General:  Skin is warm and dry.  Neurological:     General: No focal deficit present.     Mental Status: She is alert. Mental status is at baseline.  Psychiatric:        Mood and Affect: Mood normal.        Behavior: Behavior  normal.      No results found for any visits on 04/16/23.  Last CBC Lab Results  Component Value Date   WBC 5.5 03/19/2023   HGB 14.6 03/19/2023   HCT 43.5 03/19/2023   MCV 97.3 03/19/2023   MCH 32.7 03/19/2023   RDW 12.0 03/19/2023   PLT 237 03/19/2023   Last metabolic panel Lab Results  Component Value Date   GLUCOSE 83 03/19/2023   NA 138 03/19/2023   K 3.7 03/19/2023   CL 108 03/19/2023   CO2 23 03/19/2023   BUN 12 03/19/2023   CREATININE 0.94 03/19/2023   EGFR 77 03/19/2023   CALCIUM 8.2 (L) 03/19/2023   PROT 6.8 03/19/2023   BILITOT 1.1 03/19/2023   AST 14 03/19/2023   ALT 13 03/19/2023   Last lipids Lab Results  Component Value Date   CHOL 194 03/19/2023   HDL 49 (L) 03/19/2023   LDLCALC 128 (H) 03/19/2023   TRIG 77 03/19/2023   CHOLHDL 4.0 03/19/2023   Last hemoglobin A1c Lab Results  Component Value Date   HGBA1C 5.4 04/10/2022   Last thyroid functions No results found for: TSH, T3TOTAL, T4TOTAL, THYROIDAB Last vitamin D No results found for: 25OHVITD2, 25OHVITD3, VD25OH Last vitamin B12 and Folate No results found for: VITAMINB12, FOLATE    The 10-year ASCVD risk score (Arnett DK, et al., 2019) is: 0.6%    Assessment & Plan:   1. Chronic diarrhea (Primary): I think her symptoms are consistent with IBS. Agree with GI referral for colonoscopy to be sure there are no signs of IBD but cal-protectin was only mildly elevated. Infectious work up and allergy  work up negative. I recommend continuing the probiotic and fiber and we did discuss starting Xifaxan to restart gut microbiome, however since patient's GI appointment is only a few weeks away, she would prefer to discuss with them first. Info about Xifaxan  provided for the patient via MyChart and I will see her for her physical or sooner as needed.    Return in about 11 months (around 03/15/2024).    Sharyle Fischer, DO

## 2023-04-16 NOTE — Patient Instructions (Signed)
 Rifaximin Tablets What is this medication? RIFAXIMIN (ri FAX i men) treats diarrhea caused by bacteria. It may also be used to treat irritable bowel syndrome (IBS) with diarrhea. It can be used to prevent hepatic encephalopathy, a condition where the liver does not clear toxins from the body. It works by killing or preventing the growth of bacteria in your intestines that make toxins. It belongs to a group of medications called antibiotics. It will not treat colds, the flu, or infections caused by viruses. This medicine may be used for other purposes; ask your health care provider or pharmacist if you have questions. COMMON BRAND NAME(S): Xifaxan What should I tell my care team before I take this medication? They need to know if you have any of these conditions: Bloody or tarry stools Fever Liver disease An unusual or allergic reaction to rifaximin, rifampin, rifabutin, other medications, foods, dyes, or preservatives Pregnant or trying to get pregnant Breast-feeding How should I use this medication? Take this medication by mouth with water. Take it as directed on the prescription label at the same time every day. You can take it with or without food. If it upsets your stomach, take it with food. Take all of this medication unless your care team tells you to stop it early. Keep taking it even if you think you are better. Talk to your care team about the use of this medication in children. Special care may be needed. Overdosage: If you think you have taken too much of this medicine contact a poison control center or emergency room at once. NOTE: This medicine is only for you. Do not share this medicine with others. What if I miss a dose? If you miss a dose, take it as soon as you can. If it is almost time for your next dose, take only that dose. Do not take double or extra doses. What may interact with this medication? This medication may interact with the following: Cyclosporine Estrogen or  progestin hormones Midazolam Verapamil Warfarin This list may not describe all possible interactions. Give your health care provider a list of all the medicines, herbs, non-prescription drugs, or dietary supplements you use. Also tell them if you smoke, drink alcohol, or use illegal drugs. Some items may interact with your medicine. What should I watch for while using this medication? Tell your care team if your symptoms do not start to get better or if they get worse. Do not treat diarrhea with over the counter products. Contact your care team if you have diarrhea that lasts more than 2 days or if it is severe and watery. What side effects may I notice from receiving this medication? Side effects that you should report to your care team as soon as possible: Allergic reactions--skin rash, itching, hives, swelling of the face, lips, tongue, or throat Severe diarrhea, fever Unusual vaginal discharge, itching, or odor Side effects that usually do not require medical attention (report to your care team if they continue or are bothersome): Change in taste Dizziness Headache Loss of appetite with weight loss Nausea Swelling of the ankles, hands, or feet This list may not describe all possible side effects. Call your doctor for medical advice about side effects. You may report side effects to FDA at 1-800-FDA-1088. Where should I keep my medication? Keep out of the reach of children and pets. Store between 15 and 30 degrees C (59 and 86 degrees F). Get rid of any unused medication after the expiration date. To get rid of  medications that are no longer needed or have expired: Take the medication to a medication take-back program. Check with your pharmacy or law enforcement to find a location. If you cannot return the medication, check the label or package insert to see if the medication should be thrown out in the garbage or flushed down the toilet. If you are not sure, ask your care team. If it is  safe to put it in the trash, empty the medication out of the container. Mix the medication with cat litter, dirt, coffee grounds, or other unwanted substance. Seal the mixture in a bag or container. Put it in the trash. NOTE: This sheet is a summary. It may not cover all possible information. If you have questions about this medicine, talk to your doctor, pharmacist, or health care provider.  2024 Elsevier/Gold Standard (2021-06-09 00:00:00)

## 2023-05-08 NOTE — Progress Notes (Signed)
Celso Amy, PA-C [address]  [address]  [place], [place] [address]  Main: [phone]  Fax: [phone]   Gastroenterology Consultation  Referring Provider:     Margarita Mail, DO Primary Care Physician:  Margarita Mail, DO Primary Gastroenterologist:  Celso Amy, PA-C  Reason for Consultation:     Chronic diarrhea, elevated fecal calprotectin        HPI:   ARMEDA PLUMB is a 43 y.o. y/o female referred for consultation & management  by Margarita Mail, DO.    Diarrhea started February 2024 after she was drinking a specific protein shake (from [facility]) to help with weight loss.  Despite discontinuing the shakes in December 2024, she has continued to have diarrhea multiple times per day with severe urgency.  Diarrhea occurs 30 minutes after eating.  Stools are liquid and watery.  No melena or hematochezia.  Has had LUQ and LLQ cramping prior to bowel movements.  No fevers or chills.  No antibiotic use.  Patient tried fiber supplement and probiotic with mild benefit.  She continues to have 10-15 watery stools per day.  Occasional episode of nocturnal diarrhea.  Denies nausea, vomiting, fever or chills.  No recent antibiotic use or foreign travel.  She lost 40 pounds intentionally in the past year, however has recently gained 7 pounds back.  Denies melena or hematochezia.  03/2023 labs: Food allergy panel and celiac labs negative.  Normal CBC with hemoglobin 14.6, WBC 5.5.  Normal CMP.  03/2023 stool test: Negative Salmonella, Shigella, ova, and parasites.  Fecal calprotectin was elevated at 155.  Patient saw [facility] 04/2014 (Dr. Arvid Right) for chronic constipation.  Treated with MiraLAX.  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  She was referred for biofeedback pelvic floor retraining.    No previous colonoscopy.  Past Medical History:  Diagnosis Date   Allergy    Anxiety    Asthma    Cancer (HCC)    skin ca   Depression     Insomnia    Migraines    Thyroid disease     Past Surgical History:  Procedure Laterality Date   ABDOMINAL HYSTERECTOMY     CESAREAN SECTION     NASAL SINUS SURGERY     STOMACH SURGERY     THYROIDECTOMY      Prior to Admission medications   Medication Sig Start Date End Date Taking? Authorizing Provider  albuterol (VENTOLIN HFA) 108 (90 Base) MCG/ACT inhaler Inhale 2 puffs into the lungs every 6 (six) hours as needed. 07/18/09   [provider]  calcitRIOL (ROCALTROL) 0.25 MCG capsule Take 0.5 mcg by mouth daily.    [provider]  citalopram (CELEXA) 40 MG tablet Take 40 mg by mouth daily.    [provider]  clonazePAM (KLONOPIN) 0.5 MG tablet Take 0.5 mg by mouth 3 (three) times daily.    [provider]  fexofenadine (ALLEGRA) 180 MG tablet Take 180 mg by mouth daily.    [provider]  levothyroxine (SYNTHROID) 175 MCG tablet Take 175 mcg by mouth daily.    [provider]  topiramate (TOPAMAX) 100 MG tablet Take 100 mg by mouth daily.    [provider]  traZODone (DESYREL) 100 MG tablet Take 100 mg by mouth at bedtime.    [provider]  UBRELVY 100 MG TABS Take by mouth.    [provider]    Family History  Problem Relation Age of Onset  Thyroid disease Mother    Bipolar disorder Daughter    Asthma Son    Breast cancer Maternal Aunt        4 mat aunts   Breast cancer Paternal Aunt    Breast cancer Paternal Grandmother      Social History   Tobacco Use   Smoking status: Never   Smokeless tobacco: Never  Vaping Use   Vaping status: Never Used  Substance Use Topics   Alcohol use: Yes   Drug use: Never    Allergies as of 05/09/2023 - Review Complete 05/09/2023  Allergen Reaction Noted   Meperidine Hives and Other (See Comments) 09/01/2012   Penicillins Nausea Only and Other (See Comments) 09/01/2012   Sulfa antibiotics Other (See Comments) 09/01/2012    Review of  Systems:    All systems reviewed and negative except where noted in HPI.   Physical Exam:  BP 115/76   Pulse 76   Temp 97.7 F (36.5 C)   Ht 5\' 7"  (1.702 m)   Wt 206 lb (93.4 kg)   BMI 32.26 kg/m  No LMP recorded. Patient has had a hysterectomy.  General:   Alert,  Well-developed, well-nourished, pleasant and cooperative in NAD Lungs:  Respirations even and unlabored.  Clear throughout to auscultation.   No wheezes, crackles, or rhonchi. No acute distress. Heart:  Regular rate and rhythm; no murmurs, clicks, rubs, or gallops. Abdomen:  Normal bowel sounds.  No bruits.  Soft, and non-distended without masses, hepatosplenomegaly or hernias noted.  Mild left-sided abdominal tenderness.  Rest of abdomen is not tender no guarding or rebound tenderness.    Neurologic:  Alert and oriented x3;  grossly normal neurologically. Psych:  Alert and cooperative. Normal mood and affect.  Imaging Studies: No results found.  Assessment and Plan:   IMANE BURROUGH is a 43 y.o. y/o female has been referred for diarrhea for 1 year since 05/2022.  Recent celiac and allergy panel labs negative.  Symptoms are concerning for infectious diarrhea versus microscopic colitis.  IBD is also in the differential.  I am starting her evaluation with stool studies.  Specifically need to rule out C. difficile.  If stool studies are negative for infections, then we will go and schedule colonoscopy.  1.  Diarrhea  Stool Studies: GI Pathogen Panal, C. Difficile Toxin PCR  2.  Elevated fecal calprotectin  If stool studies are negative, then we will schedule colonoscopy.  3.  Left side abdominal cramping  Rx dicyclomine 10 Mg 3 times daily as needed #90, 2 refills.  Follow up in 4 weeks or after Colonoscopy with TG.  Celso Amy, PA-C

## 2023-05-09 ENCOUNTER — Encounter: Payer: Self-pay | Admitting: Physician Assistant

## 2023-05-09 ENCOUNTER — Ambulatory Visit: Payer: 59 | Admitting: Physician Assistant

## 2023-05-09 VITALS — BP 115/76 | HR 76 | Temp 97.7°F | Ht 67.0 in | Wt 206.0 lb

## 2023-05-09 DIAGNOSIS — R197 Diarrhea, unspecified: Secondary | ICD-10-CM

## 2023-05-09 DIAGNOSIS — R195 Other fecal abnormalities: Secondary | ICD-10-CM | POA: Diagnosis not present

## 2023-05-09 DIAGNOSIS — R109 Unspecified abdominal pain: Secondary | ICD-10-CM

## 2023-05-09 MED ORDER — DICYCLOMINE HCL 10 MG PO CAPS: 10.0000 mg | ORAL_CAPSULE | Freq: Three times a day (TID) | ORAL | 2 refills | Status: DC

## 2023-05-30 ENCOUNTER — Encounter: Payer: Self-pay | Admitting: Physician Assistant

## 2023-05-30 LAB — GI PROFILE, STOOL, PCR

## 2023-05-30 LAB — CLOSTRIDIUM DIFFICILE BY PCR: Toxigenic C. Difficile by PCR: NEGATIVE

## 2023-06-04 ENCOUNTER — Telehealth: Payer: Self-pay

## 2023-06-04 NOTE — Telephone Encounter (Signed)
 Left detailed message on patient's VM. C. Difficile Stool test is Negative.  Great news!  Continue with plan. Erin Cervantes

## 2023-06-06 ENCOUNTER — Telehealth: Payer: Self-pay

## 2023-06-06 ENCOUNTER — Other Ambulatory Visit: Payer: Self-pay

## 2023-06-06 DIAGNOSIS — R197 Diarrhea, unspecified: Secondary | ICD-10-CM

## 2023-06-06 MED ORDER — PEG 3350-KCL-NA BICARB-NACL 420 G PO SOLR: 4000.0000 mL | Freq: Once | ORAL | 0 refills | Status: AC

## 2023-06-06 NOTE — Telephone Encounter (Signed)
 Spoke with patient- she would like to proceed with Colonoscopy- mailed instructions today- golytely sent to pharmacy- scheduled for 07/03/23 with Dr.Anna.

## 2023-07-02 ENCOUNTER — Encounter: Payer: Self-pay | Admitting: Gastroenterology

## 2023-07-03 ENCOUNTER — Encounter
Admission: RE | Disposition: A | Discharge status: Home / Self Care | Payer: Self-pay | Source: Home / Self Care | Attending: Gastroenterology

## 2023-07-03 ENCOUNTER — Other Ambulatory Visit: Payer: Self-pay

## 2023-07-03 ENCOUNTER — Ambulatory Visit
Admission: RE | Admit: 2023-07-03 | Discharge: 2023-07-03 | Disposition: A | Discharge status: Home / Self Care | Attending: Gastroenterology | Admitting: Gastroenterology

## 2023-07-03 ENCOUNTER — Ambulatory Visit

## 2023-07-03 ENCOUNTER — Encounter: Payer: Self-pay | Admitting: Gastroenterology

## 2023-07-03 DIAGNOSIS — K529 Noninfective gastroenteritis and colitis, unspecified: Secondary | ICD-10-CM | POA: Diagnosis present

## 2023-07-03 DIAGNOSIS — E890 Postprocedural hypothyroidism: Secondary | ICD-10-CM | POA: Diagnosis not present

## 2023-07-03 DIAGNOSIS — F419 Anxiety disorder, unspecified: Secondary | ICD-10-CM | POA: Diagnosis not present

## 2023-07-03 DIAGNOSIS — F32A Depression, unspecified: Secondary | ICD-10-CM | POA: Diagnosis not present

## 2023-07-03 DIAGNOSIS — R197 Diarrhea, unspecified: Secondary | ICD-10-CM | POA: Diagnosis present

## 2023-07-03 DIAGNOSIS — K6389 Other specified diseases of intestine: Secondary | ICD-10-CM | POA: Diagnosis not present

## 2023-07-03 SURGERY — COLONOSCOPY
Anesthesia: General

## 2023-07-03 MED ORDER — LIDOCAINE HCL (PF) 2 % IJ SOLN
INTRAMUSCULAR | Status: AC
  Filled 2023-07-03: qty 5

## 2023-07-03 MED ORDER — PROPOFOL 10 MG/ML IV BOLUS
INTRAVENOUS | Status: DC | PRN
  Administered 2023-07-03: 100 mg via INTRAVENOUS

## 2023-07-03 MED ORDER — PROPOFOL 500 MG/50ML IV EMUL
INTRAVENOUS | Status: DC | PRN
  Administered 2023-07-03: 150 ug/kg/min via INTRAVENOUS

## 2023-07-03 MED ORDER — ONDANSETRON HCL 4 MG/2ML IJ SOLN
INTRAMUSCULAR | Status: AC
  Filled 2023-07-03: qty 2

## 2023-07-03 MED ORDER — LIDOCAINE HCL (CARDIAC) PF 100 MG/5ML IV SOSY
PREFILLED_SYRINGE | INTRAVENOUS | Status: DC | PRN
  Administered 2023-07-03: 50 mg via INTRAVENOUS

## 2023-07-03 MED ORDER — ONDANSETRON HCL 4 MG/2ML IJ SOLN
INTRAMUSCULAR | Status: DC | PRN
Start: 2023-07-03 — End: 2023-07-03
  Administered 2023-07-03: 4 mg via INTRAVENOUS

## 2023-07-03 MED ORDER — SODIUM CHLORIDE 0.9 % IV SOLN: INTRAVENOUS | Status: DC

## 2023-07-03 MED ORDER — PROPOFOL 10 MG/ML IV BOLUS
INTRAVENOUS | Status: AC
  Filled 2023-07-03: qty 40

## 2023-07-03 NOTE — H&P (Signed)
 Wyline Mood, MD [address], [address], [place], [place], [address] [address], [place], [place], [address] Phone: [phone]  Fax: [phone]  Primary Care Physician:  Margarita Mail, DO   Pre-Procedure History & Physical: HPI:  Erin Cervantes is a 43 y.o. female is here for an colonoscopy.   Past Medical History:  Diagnosis Date   Allergy    Anxiety    Asthma    Cancer (HCC)    skin ca   Depression    Insomnia    Migraines    Thyroid disease     Past Surgical History:  Procedure Laterality Date   ABDOMINAL HYSTERECTOMY     CESAREAN SECTION     NASAL SINUS SURGERY     STOMACH SURGERY     THYROIDECTOMY      Prior to Admission medications   Medication Sig Start Date End Date Taking? Authorizing Provider  albuterol (VENTOLIN HFA) 108 (90 Base) MCG/ACT inhaler Inhale 2 puffs into the lungs every 6 (six) hours as needed. 07/18/09   [provider]  calcitRIOL (ROCALTROL) 0.25 MCG capsule Take 0.5 mcg by mouth daily.    [provider]  citalopram (CELEXA) 40 MG tablet Take 40 mg by mouth daily.    [provider]  clonazePAM (KLONOPIN) 0.5 MG tablet Take 0.5 mg by mouth 3 (three) times daily.    [provider]  dicyclomine (BENTYL) 10 MG capsule Take 1 capsule (10 mg total) by mouth 3 (three) times daily before meals. 05/09/23 08/07/23  Celso Amy, PA-C  fexofenadine (ALLEGRA) 180 MG tablet Take 180 mg by mouth daily.    [provider]  levothyroxine (SYNTHROID) 175 MCG tablet Take 175 mcg by mouth daily.    [provider]  topiramate (TOPAMAX) 100 MG tablet Take 100 mg by mouth daily.    [provider]  traZODone (DESYREL) 100 MG tablet Take 100 mg by mouth at bedtime.    [provider]  UBRELVY 100 MG TABS Take by mouth.    [provider]    Allergies as of 06/06/2023 - Review Complete 05/09/2023  Allergen Reaction Noted   Meperidine Hives and Other  (See Comments) 09/01/2012   Penicillins Nausea Only and Other (See Comments) 09/01/2012   Sulfa antibiotics Other (See Comments) 09/01/2012    Family History  Problem Relation Age of Onset   Thyroid disease Mother    Bipolar disorder Daughter    Asthma Son    Breast cancer Maternal Aunt        4 mat aunts   Breast cancer Paternal Aunt    Breast cancer Paternal Grandmother     Social History   Socioeconomic History   Marital status: Married    Spouse name: Not on file   Number of children: Not on file   Years of education: Not on file   Highest education level: Associate degree: occupational, Scientist, product/process development, or vocational program  Occupational History   Not on file  Tobacco Use   Smoking status: Never   Smokeless tobacco: Never  Vaping Use   Vaping status: Never Used  Substance and Sexual Activity   Alcohol use: Yes   Drug use: Never   Sexual activity: Yes  Other Topics Concern   Not on file  Social History Narrative   Not on file   Social Drivers of Health   Financial Resource Strain: Low Risk  (03/15/2023)   Overall Financial Resource Strain (CARDIA)  Difficulty of Paying Living Expenses: Not hard at all  Food Insecurity: No Food Insecurity (03/15/2023)   Hunger Vital Sign    Worried About Running Out of Food in the Last Year: Never true    Ran Out of Food in the Last Year: Never true  Transportation Needs: No Transportation Needs (03/15/2023)   PRAPARE - Administrator, Civil Service (Medical): No    Lack of Transportation (Non-Medical): No  Physical Activity: Sufficiently Active (03/15/2023)   Exercise Vital Sign    Days of Exercise per Week: 5 days    Minutes of Exercise per Session: 60 min  Stress: No Stress Concern Present (03/15/2023)   [employer] of Occupational Health - Occupational Stress Questionnaire    Feeling of Stress : Only a little  Social Connections: Socially Isolated (03/15/2023)   Social Connection and Isolation Panel  [NHANES]    Frequency of Communication with Friends and Family: Once a week    Frequency of Social Gatherings with Friends and Family: Once a week    Attends Religious Services: Never    Database administrator or Organizations: No    Attends Engineer, structural: Not on file    Marital Status: Married  Catering manager Violence: Not on file    Review of Systems: See HPI, otherwise negative ROS  Physical Exam: There were no vitals taken for this visit. General:   Alert,  pleasant and cooperative in NAD Head:  Normocephalic and atraumatic. Neck:  Supple; no masses or thyromegaly. Lungs:  Clear throughout to auscultation, normal respiratory effort.    Heart:  +S1, +S2, Regular rate and rhythm, No edema. Abdomen:  Soft, nontender and nondistended. Normal bowel sounds, without guarding, and without rebound.   Neurologic:  Alert and  oriented x4;  grossly normal neurologically.  Impression/Plan: Erin Cervantes is here for an colonoscopy to be performed for diarrhea.  Risks, benefits, limitations, and alternatives regarding  colonoscopy have been reviewed with the patient.  Questions have been answered.  All parties agreeable.   Wyline Mood, MD  07/03/2023, 11:23 AM

## 2023-07-03 NOTE — Anesthesia Postprocedure Evaluation (Signed)
 Anesthesia Post Note  Patient: TANEISHA FUSON  Procedure(s) Performed: COLONOSCOPY  Patient location during evaluation: Endoscopy Anesthesia Type: General Level of consciousness: awake and alert Pain management: pain level controlled Vital Signs Assessment: post-procedure vital signs reviewed and stable Respiratory status: spontaneous breathing, nonlabored ventilation, respiratory function stable and patient connected to nasal cannula oxygen Cardiovascular status: blood pressure returned to baseline and stable Postop Assessment: no apparent nausea or vomiting Anesthetic complications: no   No notable events documented.   Last Vitals:  Vitals:   07/03/23 1326 07/03/23 1346  BP: 118/77 128/80  Pulse: 65   Resp: 20   Temp: 37 C   SpO2: 100%     Last Pain:  Vitals:   07/03/23 1346  TempSrc:   PainSc: 0-No pain                 Corinda Gubler

## 2023-07-03 NOTE — Op Note (Signed)
 [facility] Gastroenterology Patient Name: Erin Cervantes Procedure Date: 07/03/2023 12:08 PM MRN: [id number] Account #: [id number] Date of Birth: 1980-07-29 Admit Type: Outpatient Age: 43 Room: [facility] ENDO ROOM 3 Gender: Female Note Status: Finalized Instrument Name: Colonoscope [id number],Peds Colonoscope [id number] Procedure:             Colonoscopy Indications:           Chronic diarrhea Providers:             Wyline Mood MD, MD Referring MD:          Margarita Mail (Referring MD) Medicines:             Monitored Anesthesia Care Complications:         No immediate complications. Procedure:             Pre-Anesthesia Assessment:                        - Prior to the procedure, a History and Physical was                         performed, and patient medications, allergies and                         sensitivities were reviewed. The patient's tolerance                         of previous anesthesia was reviewed.                        - The risks and benefits of the procedure and the                         sedation options and risks were discussed with the                         patient. All questions were answered and informed                         consent was obtained.                        - ASA Grade Assessment: II - A patient with mild                         systemic disease.                        After obtaining informed consent, the colonoscope was                         passed under direct vision. Throughout the procedure,                         the patient's blood pressure, pulse, and oxygen                         saturations were monitored continuously. The                         Colonoscope was introduced through the  anus and                         advanced to the the terminal ileum. The Colonoscope                         was introduced through the and advanced to the the                         terminal ileum. The colonoscopy was performed  with                         ease. The patient tolerated the procedure well. The                         quality of the bowel preparation was excellent. The                         ileocecal valve, appendiceal orifice, and rectum were                         photographed. Findings:      The perianal and digital rectal examinations were normal.      The terminal ileum appeared normal. Biopsies were taken with a cold       forceps for histology.      The colon (entire examined portion) appeared normal. Biopsies were taken       with a cold forceps for histology.      The exam was otherwise without abnormality on direct and retroflexion       views. Impression:            - The examined portion of the ileum was normal.                         Biopsied.                        - The entire examined colon is normal. Biopsied.                        - The examination was otherwise normal on direct and                         retroflexion views. Recommendation:        - Discharge patient to home (with escort).                        - Resume previous diet.                        - Continue present medications.                        - Await pathology results.                        - Return to my office as previously scheduled. Procedure Code(s):     --- Professional ---  13086, Colonoscopy, flexible; with biopsy, single or                         multiple Diagnosis Code(s):     --- Professional ---                        K52.9, Noninfective gastroenteritis and colitis,                         unspecified CPT copyright 2022 American Medical Association. All rights reserved. The codes documented in this report are preliminary and upon coder review may  be revised to meet current compliance requirements. Wyline Mood, MD Wyline Mood MD, MD 07/03/2023 1:24:58 PM This report has been signed electronically. Number of Addenda: 0 Note Initiated On: 07/03/2023 12:08 PM Scope  Withdrawal Time: 0 hours 14 minutes 17 seconds  Total Procedure Duration: 0 hours 17 minutes 30 seconds  Estimated Blood Loss:  Estimated blood loss: none.      [facility]

## 2023-07-03 NOTE — Anesthesia Preprocedure Evaluation (Signed)
 Anesthesia Evaluation  Patient identified by MRN, date of birth, ID band Patient awake  General Assessment Comment:  Patient says she woke up during a hysterectomy. Does not remember feeling any pain before being put to sleep.  Reviewed: Allergy & Precautions, NPO status , Patient's Chart, lab work & pertinent test results  History of Anesthesia Complications Negative for: history of anesthetic complications  Airway Mallampati: II  TM Distance: >3 FB Neck ROM: Full    Dental no notable dental hx. (+) Teeth Intact   Pulmonary neg pulmonary ROS, neg sleep apnea, neg COPD, Patient abstained from smoking.Not current smoker   Pulmonary exam normal breath sounds clear to auscultation       Cardiovascular Exercise Tolerance: Good METS(-) hypertension(-) CAD and (-) Past MI negative cardio ROS (-) dysrhythmias  Rhythm:Regular Rate:Normal - Systolic murmurs    Neuro/Psych  Headaches PSYCHIATRIC DISORDERS Anxiety Depression       GI/Hepatic ,GERD  ,,(+)     (-) substance abuse    Endo/Other  neg diabetesHypothyroidism    Renal/GU negative Renal ROS     Musculoskeletal   Abdominal  (+) + obese  Peds  Hematology   Anesthesia Other Findings Past Medical History: No date: Allergy No date: Anxiety No date: Asthma No date: Cancer [facility])     Comment:  skin ca No date: Depression No date: Insomnia No date: Migraines No date: Thyroid disease  Reproductive/Obstetrics                             Anesthesia Physical Anesthesia Plan  ASA: 2  Anesthesia Plan: General   Post-op Pain Management: Minimal or no pain anticipated   Induction: Intravenous  PONV Risk Score and Plan: 3 and Propofol infusion, TIVA and Ondansetron  Airway Management Planned: Nasal Cannula  Additional Equipment: None  Intra-op Plan:   Post-operative Plan:   Informed Consent: I have reviewed the patients History and  Physical, chart, labs and discussed the procedure including the risks, benefits and alternatives for the proposed anesthesia with the patient or authorized representative who has indicated his/her understanding and acceptance.     Dental advisory given  Plan Discussed with: CRNA and Surgeon  Anesthesia Plan Comments: (Discussed risks of anesthesia with patient, including possibility of difficulty with spontaneous ventilation under anesthesia necessitating airway intervention, PONV, and rare risks such as cardiac or respiratory or neurological events, and allergic reactions. Discussed the role of CRNA in patient's perioperative care. Patient understands.)       Anesthesia Quick Evaluation

## 2023-07-03 NOTE — Transfer of Care (Signed)
 Immediate Anesthesia Transfer of Care Note  Patient: Erin Cervantes  Procedure(s) Performed: COLONOSCOPY  Patient Location: PACU  Anesthesia Type:MAC  Level of Consciousness: awake  Airway & Oxygen Therapy: Patient Spontanous Breathing  Post-op Assessment: Report given to RN and Post -op Vital signs reviewed and stable  Post vital signs: Reviewed and stable  Last Vitals:  Vitals Value Taken Time  BP 118/77 07/03/23 1326  Temp    Pulse 64 07/03/23 1326  Resp 21 07/03/23 1326  SpO2 100 % 07/03/23 1326  Vitals shown include unfiled device data.  Last Pain:  Vitals:   07/03/23 1208  TempSrc: Temporal  PainSc: 0-No pain         Complications: No notable events documented.

## 2023-07-04 ENCOUNTER — Encounter: Payer: Self-pay | Admitting: Gastroenterology

## 2023-07-04 LAB — SURGICAL PATHOLOGY

## 2023-07-15 ENCOUNTER — Telehealth: Payer: Self-pay

## 2023-07-15 NOTE — Telephone Encounter (Signed)
 Per Dr Antony Baumgartner- patient notified and I let her know that Dr.Anna was leaving the end of May and to call [facility] in 2-3 months to schedule an appointment.   Inform that the biopsies from the colon showed acute colitis which is usually seen with an infection or the use of NSAIDs.  This usually resolves on its own.  Very seldom this could be the beginning of inflammatory bowel disease.  Check how the patie nt is doing if she still has diarrhea.  If she still has diarrhea she will need to be seen at the office for a follow-up if not having diarrhea then she should have a nonurgent follow-up in 2 months time.

## 2023-07-15 NOTE — Telephone Encounter (Signed)
error 

## 2023-08-07 ENCOUNTER — Other Ambulatory Visit: Payer: Self-pay | Admitting: Physician Assistant

## 2023-08-07 DIAGNOSIS — R109 Unspecified abdominal pain: Secondary | ICD-10-CM

## 2023-11-19 ENCOUNTER — Other Ambulatory Visit: Payer: Self-pay

## 2023-11-19 ENCOUNTER — Encounter: Payer: Self-pay | Admitting: Internal Medicine

## 2023-11-19 ENCOUNTER — Ambulatory Visit: Admitting: Internal Medicine

## 2023-11-19 VITALS — BP 120/84 | HR 100 | Temp 97.8°F | Resp 16 | Ht 67.0 in | Wt 231.9 lb

## 2023-11-19 DIAGNOSIS — R454 Irritability and anger: Secondary | ICD-10-CM

## 2023-11-19 DIAGNOSIS — R232 Flushing: Secondary | ICD-10-CM

## 2023-11-19 LAB — FSH/LH
FSH: 5.2 m[IU]/mL
LH: 4.4 m[IU]/mL

## 2023-11-19 NOTE — Progress Notes (Signed)
 Acute Office Visit  Subjective:     Patient ID: Erin Cervantes, female    DOB: 1980-05-07, 43 y.o.   MRN: [id number]  Chief Complaint  Patient presents with   Menopause    Discuss symptoms    HPI Patient is in today for menopause.   Discussed the use of AI scribe software for clinical note transcription with the patient, who gave verbal consent to proceed.  History of Present Illness Erin Cervantes is a 43 year old female who presents with hormonal symptoms following a hysterectomy.  She experiences hot flashes, extreme irritability, and severe anxiety with a 'sense of doom' over the past three months. Anxiety episodes can last up to three days. Hot flashes occur at night or in warm environments, but she has not had a full-body hot flash.  She has significant joint inflammation, with occasional immobility in her fingers and tight hips, leading to a shuffling gait.  Her current medication is Celexa. She had an allergic reaction to Effexor, causing lightheadedness and fainting. She discontinued Topamax due to cognitive delay and arm numbness, with only one headache since stopping.  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 She has three children.    Review of Systems  Constitutional:  Positive for malaise/fatigue. Negative for chills and fever.  Psychiatric/Behavioral:  The patient is nervous/anxious.         Objective:    BP 120/84 (Cuff Size: Large)   Pulse 100   Temp 97.8 F (36.6 C) (Oral)   Resp 16   Ht 5' 7 (1.702 m)   Wt 231 lb 14.4 oz (105.2 kg)   SpO2 96%   BMI 36.32 kg/m  BP Readings from Last 3 Encounters:  11/19/23 120/84  07/03/23 128/80  05/09/23 115/76   Wt Readings from Last 3 Encounters:  11/19/23 231 lb 14.4 oz (105.2 kg)  07/03/23 206 lb (93.4 kg)  05/09/23 206 lb (93.4 kg)       Physical Exam Constitutional:      Appearance: Normal appearance.  HENT:     Head: Normocephalic and atraumatic.  Eyes:     Conjunctiva/sclera: Conjunctivae normal.  Cardiovascular:     Rate and Rhythm: Normal rate and regular rhythm.  Pulmonary:     Effort: Pulmonary effort is normal.     Breath sounds: Normal breath sounds.  Skin:    General: Skin is warm and dry.  Neurological:     General: No focal deficit present.     Mental Status: She is alert. Mental status is at baseline.  Psychiatric:        Mood and Affect: Mood normal.        Behavior: Behavior normal.     No results found for any visits on 11/19/23.      Assessment & Plan:   Assessment & Plan Perimenopausal symptoms after hysterectomy with retained left ovary Experiencing perimenopausal symptoms likely due to hormonal changes from possible ovarian failure. Discussed hormone replacement therapy risks and alternative treatments like Black Cohosh. - Order FSH and LH hormone levels to assess ovarian function. - Recommend over-the-counter Black Cohosh for hormonal symptoms. - Consider gynecology referral if symptoms persist and hormone levels are elevated. - Discuss risks of hormone replacement therapy, including increased risk of breast cancer and blood clots.  Anxiety symptoms Anxiety symptoms part of perimenopausal symptomatology.  Celexa currently used for management. Effexor not recommended due to past adverse reaction. - Continue current Celexa regimen. - Document adverse reaction to Effexor in medical record. - Consider adjusting Celexa dosage or adding Wellbutrin if anxiety symptoms persist after hormone evaluation.  - FSH/LH  Return in about 4 months (around 03/20/2024).  Sharyle Fischer, DO

## 2023-11-19 NOTE — Patient Instructions (Signed)
 Black Cohosh Supplements What is this medication? BLACK COHOSH (blak KOH hosh) may relieve the symptoms of menopause. The FDA has not evaluated this supplement for any medical use. It may contain ingredients not listed. Discuss all supplements you are taking with your care team. They can provide you with important safety information. This medicine may be used for other purposes; ask your health care provider or pharmacist if you have questions. What should I tell my care team before I take this medication? They need to know if you have any of these conditions: Health issues you have now or have had in the past An unusual or allergic reaction to any medication, supplement, food, dye, or preservative Pregnant or trying to get pregnant Breastfeeding How should I use this medication? Take this supplement as directed on the label. Do not take it more often than recommended. Talk to your care team if you take other medications or supplements. Talk to your care team before giving supplements to a child. Special care may be needed. Overdosage: If you think you have taken too much of this medicine contact a poison control center or emergency room at once. NOTE: This medicine is only for you. Do not share this medicine with others. What if I miss a dose? Do not use this supplement more often than directed. What may interact with this medication? Other medications or supplements may interact with this product. This list may not describe all possible interactions. Give your health care provider a list of all the medicines, herbs, non-prescription drugs, or dietary supplements you use. Also tell them if you smoke, drink alcohol, or use illegal drugs. Some items may interact with your medicine. What should I watch for while using this medication? See your care team for regular checks on your progress. What side effects may I notice from receiving this medication? Side effects that you should report to your  care team as soon as possible: Allergic reactions--skin rash, itching, hives, swelling of the face, lips, tongue, or throat Liver injury--right upper belly pain, loss of appetite, nausea, light-colored stool, dark yellow or brown urine, yellowing skin or eyes, unusual weakness or fatigue Side effects that usually do not require medical attention (report these to your care team if they continue or are bothersome): Breast pain or tenderness Irregular menstrual cycles or spotting Nausea Upset stomach This list may not describe all possible side effects. Call your doctor for medical advice about side effects. You may report side effects to FDA at 1-800-FDA-1088. Where should I keep my medication? Keep out of the reach of children and pets. Most supplements should be stored at room temperature. Check your specific product directions. Protect from heat and moisture. Get rid of any unused supplement after the expiration date. NOTE: This sheet is a summary. It may not cover all possible information. If you have questions about this medicine, talk to your doctor, pharmacist, or health care provider.  2025 Elsevier/Gold Standard (2022-08-20 00:00:00)

## 2023-11-20 ENCOUNTER — Ambulatory Visit: Payer: Self-pay | Admitting: Internal Medicine

## 2023-12-08 ENCOUNTER — Emergency Department
Admission: EM | Admit: 2023-12-08 | Discharge: 2023-12-08 | Disposition: A | Discharge status: Home / Self Care | Attending: Emergency Medicine | Admitting: Emergency Medicine

## 2023-12-08 ENCOUNTER — Other Ambulatory Visit: Payer: Self-pay

## 2023-12-08 DIAGNOSIS — T7840XA Allergy, unspecified, initial encounter: Secondary | ICD-10-CM | POA: Insufficient documentation

## 2023-12-08 MED ORDER — PREDNISONE 10 MG PO TABS
10.0000 mg | ORAL_TABLET | Freq: Every day | ORAL | 0 refills | Status: DC
Start: 2023-12-08 — End: 2023-12-08

## 2023-12-08 MED ORDER — FAMOTIDINE IN NACL 20-0.9 MG/50ML-% IV SOLN
20.0000 mg | Freq: Once | INTRAVENOUS | Status: AC
  Administered 2023-12-08: 20 mg via INTRAVENOUS
  Filled 2023-12-08: qty 50

## 2023-12-08 MED ORDER — PREDNISONE 10 MG PO TABS: 10.0000 mg | ORAL_TABLET | Freq: Every day | ORAL | 0 refills | Status: DC

## 2023-12-08 MED ORDER — DEXAMETHASONE SODIUM PHOSPHATE 10 MG/ML IJ SOLN
10.0000 mg | Freq: Once | INTRAMUSCULAR | Status: AC
  Administered 2023-12-08: 10 mg via INTRAVENOUS
  Filled 2023-12-08: qty 1

## 2023-12-08 MED ORDER — SODIUM CHLORIDE 0.9 % IV BOLUS
1000.0000 mL | Freq: Once | INTRAVENOUS | Status: AC
  Administered 2023-12-08: 1000 mL via INTRAVENOUS

## 2023-12-08 NOTE — ED Provider Notes (Signed)
 [place] EMERGENCY DEPARTMENT AT [facility] Provider Note   CSN: [id number] Arrival date & time: 12/08/23  9142     Patient presents with: Allergic Reaction   Erin Cervantes is a 43 y.o. female with history of migraines, reflux, hypoparathyroidism presents to the emergency department for evaluation of allergic reaction.  Patient states she took topiramate last night for her headache.  She had recently just started this medication and took it for the first time last night.  This morning she woke up and was having swelling in her face arms and legs.  She has had numerous other reactions to medications and states this is her normal allergic reaction symptoms.  She took Benadryl and has had some resolution of some of the redness and swelling in her extremities as well as her face and tongue.  She denies any chest pain or shortness of breath.  She states she feels a little sleepy and dizzy at this time.  No weakness, numbness or vision changes.      Prior to Admission medications   Medication Sig Start Date End Date Taking? Authorizing Provider  albuterol (VENTOLIN HFA) 108 (90 Base) MCG/ACT inhaler Inhale 2 puffs into the lungs every 6 (six) hours as needed. 07/18/09   [provider]  calcitRIOL (ROCALTROL) 0.25 MCG capsule Take 0.5 mcg by mouth daily.    [provider]  citalopram (CELEXA) 40 MG tablet Take 40 mg by mouth daily.    [provider]  clonazePAM (KLONOPIN) 0.5 MG tablet Take 0.5 mg by mouth 3 (three) times daily.    [provider]  fexofenadine (ALLEGRA) 180 MG tablet Take 180 mg by mouth daily.    [provider]  levothyroxine (SYNTHROID) 175 MCG tablet Take 175 mcg by mouth daily.    [provider]  traZODone (DESYREL) 100 MG tablet Take 100 mg by mouth at bedtime.    [provider]  UBRELVY 100 MG TABS Take by mouth.    [provider]    Allergies: Effexor xr [venlafaxine hcl],  Meperidine, Penicillins, and Sulfa antibiotics    Review of Systems  Updated Vital Signs BP (!) 130/93   Pulse 72   Temp 98 F (36.7 C)   Resp 18   Ht 5' 7 (1.702 m)   Wt 104.3 kg   SpO2 95%   BMI 36.02 kg/m   Physical Exam Constitutional:      Appearance: Normal appearance. She is well-developed.     Comments: Ambulates well with no antalgia gait no assist device.  HENT:     Head: Normocephalic and atraumatic.     Right Ear: External ear normal.     Left Ear: External ear normal.     Mouth/Throat:     Pharynx: Oropharynx is clear.  Eyes:     Extraocular Movements: Extraocular movements intact.     Conjunctiva/sclera: Conjunctivae normal.     Pupils: Pupils are equal, round, and reactive to light.  Cardiovascular:     Rate and Rhythm: Normal rate.     Pulses: Normal pulses.     Heart sounds: Normal heart sounds.  Pulmonary:     Effort: Pulmonary effort is normal. No respiratory distress.     Breath sounds: Normal breath sounds.  Musculoskeletal:        General: Normal range of motion.     Cervical back: Normal range of motion.  Skin:    General: Skin is warm.     Findings: No  rash.     Comments: Mild swelling and erythema of both dorsums of the arms, lower legs and feet.  Mild facial swelling, no intraoral tongue swelling.  No facial redness.  No other rash throughout the body.  Neurological:     General: No focal deficit present.     Mental Status: She is alert and oriented to person, place, and time. Mental status is at baseline.     Cranial Nerves: No cranial nerve deficit.     Motor: No weakness.     Gait: Gait normal.  Psychiatric:        Mood and Affect: Mood normal.        Behavior: Behavior normal.        Thought Content: Thought content normal.     (all labs ordered are listed, but only abnormal results are displayed) Labs Reviewed - No data to display  EKG: None  Radiology: No results found.   Procedures   Medications Ordered in the ED   sodium chloride  0.9 % bolus 1,000 mL (has no administration in time range)  dexamethasone  (DECADRON ) injection 10 mg (has no administration in time range)  famotidine  (PEPCID ) IVPB 20 mg premix (has no administration in time range)                                    Medical Decision Making Risk Prescription drug management.   43 year old female with allergic reaction medication she took it last night for the first time, topiramate.  She woke up this morning with swelling redness on her lower legs upper arms and face.  No shortness of breath or difficulty swallowing.  She took Benadryl and saw some improvement.  She presented to the ER, vital signs stable.  She was little drowsy from the Benadryl but no shortness of breath or difficulty swallowing.  She was given IV fluids, dexamethasone  and Pepcid .  Symptoms have resolved and she is feeling well with no symptoms of rash, swelling.  Patient stable and ready for discharge to home.  Will send a 6-day steroid taper and educated her on signs of recurring symptoms and signs and symptoms to return to the ER for.  Final diagnoses:  Allergic reaction, initial encounter    ED Discharge Orders     None          Skila Rollins C, PA-C 12/08/23 1030    Arlander Charleston, MD 12/08/23 1207

## 2023-12-08 NOTE — ED Triage Notes (Signed)
 Pt comes with swelling to face, hands and feet. Pt state she started a new medication last night. Pt unsure if it is from that or not. Pt denies any rash or hives.

## 2023-12-08 NOTE — Discharge Instructions (Addendum)
 If your symptoms recur please restart Benadryl and prednisone .  Return to the ER for any difficulty swallowing, chest tightness, difficulty breathing, swelling or any worsening symptoms or any urgent changes in your health.

## 2023-12-24 ENCOUNTER — Ambulatory Visit: Admitting: Internal Medicine

## 2023-12-24 ENCOUNTER — Encounter: Payer: Self-pay | Admitting: Internal Medicine

## 2023-12-24 ENCOUNTER — Other Ambulatory Visit: Payer: Self-pay

## 2023-12-24 VITALS — BP 120/70 | HR 94 | Temp 97.8°F | Resp 16 | Ht 67.0 in | Wt 235.4 lb

## 2023-12-24 DIAGNOSIS — E876 Hypokalemia: Secondary | ICD-10-CM

## 2023-12-24 DIAGNOSIS — Z23 Encounter for immunization: Secondary | ICD-10-CM

## 2023-12-24 LAB — BASIC METABOLIC PANEL WITH GFR
BUN/Creatinine Ratio: 12 (calc) (ref 6–22)
BUN: 12 mg/dL (ref 7–25)
CO2: 31 mmol/L (ref 20–32)
Calcium: 8.5 mg/dL — ABNORMAL LOW (ref 8.6–10.2)
Chloride: 101 mmol/L (ref 98–110)
Creat: 1.03 mg/dL — ABNORMAL HIGH (ref 0.50–0.99)
Glucose, Bld: 76 mg/dL (ref 65–99)
Potassium: 3.6 mmol/L (ref 3.5–5.3)
Sodium: 140 mmol/L (ref 135–146)
eGFR: 69 mL/min/1.73m2 (ref >=60)

## 2023-12-24 NOTE — Progress Notes (Signed)
 Established Patient Office Visit  Subjective   Patient ID: Erin Cervantes, female    DOB: 05/08/1980  Age: 43 y.o. MRN: [id number]  Chief Complaint  Patient presents with   Allergic Reaction    To medication seen in ER    HPI  Patient is here today for hospital follow up.  Discussed the use of AI scribe software for clinical note transcription with the patient, who gave verbal consent to proceed.  History of Present Illness Erin Cervantes is a 43 year old female who presents with concerns about low potassium and calcium levels.  She has low potassium and calcium levels, as noted by her endocrinologist. She takes a multivitamin and calcium supplements, including Tums and calcium chews, to manage these deficiencies. She has a history of low calcium since her thyroidectomy.  She experiences gastrointestinal issues with bowel movements 10-15 times daily. A colonoscopy indicated acute colitis. She reports rapid transit of food resulting in liquid stools. Probiotics, fiber, and a gluten-free diet have not provided relief.  She has a history of migraines and was previously on Topamax. After discontinuing the medication, she experienced multiple migraines and had a severe reaction upon restarting it, requiring an ER visit. She has not seen her neurologist but consulted with a pharmacist about a new injectable migraine medication covered by her insurance.  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   Postprocedural hypothyroidism 04/09/2011   Past Medical History:  Diagnosis Date   Allergy     Anxiety    Asthma    Cancer (HCC)    skin ca   Depression    Insomnia    Migraines    Thyroid disease    Past Surgical History:  Procedure Laterality Date   ABDOMINAL HYSTERECTOMY     CESAREAN SECTION     COLONOSCOPY N/A 07/03/2023   Procedure: COLONOSCOPY;  Surgeon: Therisa Bi, MD;  Location: [facility] ENDOSCOPY;  Service: Gastroenterology;  Laterality: N/A;   LAPAROSCOPY     NASAL SINUS SURGERY     THYROIDECTOMY     Social History   Tobacco Use   Smoking status: Never   Smokeless tobacco: Never  Vaping Use   Vaping status: Never Used  Substance Use Topics   Alcohol use: Yes    Alcohol/week: 7.0 standard drinks of alcohol    Types: 7 Shots of liquor per week    Comment: daily 2 margs   Drug use: Never   Social History   Socioeconomic History   Marital status: Married    Spouse name: Not on file   Number of children: Not on file   Years of education: Not on file   Highest education level: Associate degree: occupational, Scientist, product/process development, or vocational program  Occupational History   Not on file  Tobacco Use   Smoking status: Never   Smokeless tobacco: Never  Vaping Use   Vaping status: Never Used  Substance and Sexual Activity   Alcohol use: Yes    Alcohol/week: 7.0 standard drinks  of alcohol    Types: 7 Shots of liquor per week    Comment: daily 2 margs   Drug use: Never   Sexual activity: Yes  Other Topics Concern   Not on file  Social History Narrative   Not on file   Social Drivers of Health   Financial Resource Strain: Low Risk  (11/18/2023)   Overall Financial Resource Strain (CARDIA)    Difficulty of Paying Living Expenses: Not hard at all  Food Insecurity: No Food Insecurity (11/18/2023)   Hunger Vital Sign    Worried About Running Out of Food in the Last Year: Never true     Ran Out of Food in the Last Year: Never true  Transportation Needs: No Transportation Needs (11/18/2023)   PRAPARE - Administrator, Civil Service (Medical): No    Lack of Transportation (Non-Medical): No  Physical Activity: Insufficiently Active (11/18/2023)   Exercise Vital Sign    Days of Exercise per Week: 5 days    Minutes of Exercise per Session: 20 min  Stress: Stress Concern Present (11/18/2023)   [employer] of Occupational Health - Occupational Stress Questionnaire    Feeling of Stress: To some extent  Social Connections: Moderately Isolated (11/18/2023)   Social Connection and Isolation Panel    Frequency of Communication with Friends and Family: Three times a week    Frequency of Social Gatherings with Friends and Family: Once a week    Attends Religious Services: Never    Database administrator or Organizations: No    Attends Engineer, structural: Not on file    Marital Status: Married  Catering manager Violence: Not on file   Family Status  Relation Name Status   Mother  Alive   Father  Deceased   Sister 1 Alive   Daughter 1 Alive   Son 2 Chemical engineer  (Not Specified)   Oceanographer  Alive   PGM  Deceased  No partnership data on file   Family History  Problem Relation Age of Onset   Thyroid disease Mother    Bipolar disorder Daughter    Asthma Son    Breast cancer Maternal Aunt        4 mat aunts   Breast cancer Paternal Aunt    Breast cancer Paternal Grandmother    Allergies  Allergen Reactions   Topiramate Anaphylaxis and Swelling    Angioedema with additional serum sickness features  ER visit 12/2023, 4 hours after a dose, had taken for years for migraines but had been off ~ 2 months at the time of this dose   Effexor Xr [Venlafaxine Hcl] Other (See Comments)    Lightheadedness, syncope    Meperidine Hives and Other (See Comments)    hives   Penicillins Nausea Only and Other (See Comments)    hives   Sulfa Antibiotics Other  (See Comments)    hives      ROS    Objective:     BP 120/70 (Cuff Size: Large)   Pulse 94   Temp 97.8 F (36.6 C) (Oral)   Resp 16   Ht 5' 7 (1.702 m)   Wt 235 lb 6.4 oz (106.8 kg)   SpO2 100%   BMI 36.87 kg/m  BP Readings from Last 3 Encounters:  12/24/23 120/70  12/08/23 124/62  11/19/23 120/84   Wt Readings from Last 3 Encounters:  12/24/23 235 lb 6.4 oz (106.8 kg)  12/08/23 230 lb (  104.3 kg)  11/19/23 231 lb 14.4 oz (105.2 kg)      Physical Exam Constitutional:      Appearance: Normal appearance.  HENT:     Head: Normocephalic and atraumatic.  Eyes:     Conjunctiva/sclera: Conjunctivae normal.  Neck:     Comments: No thyromegaly Cardiovascular:     Rate and Rhythm: Normal rate and regular rhythm.  Pulmonary:     Effort: Pulmonary effort is normal.     Breath sounds: Normal breath sounds.  Musculoskeletal:     Cervical back: No tenderness.     Right lower leg: No edema.     Left lower leg: No edema.  Lymphadenopathy:     Cervical: No cervical adenopathy.  Skin:    General: Skin is warm and dry.  Neurological:     General: No focal deficit present.     Mental Status: She is alert. Mental status is at baseline.  Psychiatric:        Mood and Affect: Mood normal.        Behavior: Behavior normal.      No results found for any visits on 12/24/23.  Last CBC Lab Results  Component Value Date   WBC 5.5 03/19/2023   HGB 14.6 03/19/2023   HCT 43.5 03/19/2023   MCV 97.3 03/19/2023   MCH 32.7 03/19/2023   RDW 12.0 03/19/2023   PLT 237 03/19/2023   Last metabolic panel Lab Results  Component Value Date   GLUCOSE 83 03/19/2023   NA 138 03/19/2023   K 3.7 03/19/2023   CL 108 03/19/2023   CO2 23 03/19/2023   BUN 12 03/19/2023   CREATININE 0.94 03/19/2023   EGFR 77 03/19/2023   CALCIUM 8.2 (L) 03/19/2023   PROT 6.8 03/19/2023   BILITOT 1.1 03/19/2023   AST 14 03/19/2023   ALT 13 03/19/2023   Last lipids Lab Results  Component Value  Date   CHOL 194 03/19/2023   HDL 49 (L) 03/19/2023   LDLCALC 128 (H) 03/19/2023   TRIG 77 03/19/2023   CHOLHDL 4.0 03/19/2023   Last hemoglobin A1c Lab Results  Component Value Date   HGBA1C 5.4 04/10/2022   Last thyroid functions No results found for: TSH, T3TOTAL, T4TOTAL, THYROIDAB Last vitamin D No results found for: 25OHVITD2, 25OHVITD3, VD25OH Last vitamin B12 and Folate No results found for: VITAMINB12, FOLATE    The 10-year ASCVD risk score (Arnett DK, et al., 2019) is: 0.8%    Assessment & Plan:   Assessment & Plan 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 Colonoscopy showed neutrophilic inflammation. Negative for bacterial infections. Elevated calprotectin indicates inflammation. Gluten-free diet and probiotics ineffective. - Follow-up with GI specialist for further evaluation. - Consider additional diagnostic tests for chronic inflammation.  Hypokalemia Hypokalemia likely due to chronic diarrhea and GI losses. Identified by endocrinologist. Risks include muscle spasms and cardiac issues. - Order BMP to recheck potassium levels. - Consider prescribing potassium supplements, likely 20 mEq for a few days, based on BMP results. - Discuss potential side effects of potassium supplements.  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 Managed with calcium chews and Tums. Current calcium level is 7.5 mg/dL. - Order BMP to recheck calcium levels. - Continue calcium chews and Tums. - Adjust calcium supplementation based on BMP results.  - Flu vaccine trivalent PF, 6mos and older(Flulaval,Afluria,Fluarix,Fluzone) - Basic Metabolic Panel (BMET)  Return if symptoms worsen or fail to improve.  Sharyle Fischer, DO

## 2023-12-25 ENCOUNTER — Ambulatory Visit: Payer: Self-pay | Admitting: Internal Medicine

## 2024-01-24 ENCOUNTER — Encounter: Payer: Self-pay | Admitting: Gastroenterology

## 2024-01-24 ENCOUNTER — Encounter
Admission: RE | Disposition: A | Discharge status: Home / Self Care | Payer: Self-pay | Source: Home / Self Care | Attending: Gastroenterology

## 2024-01-24 ENCOUNTER — Ambulatory Visit
Admission: RE | Admit: 2024-01-24 | Discharge: 2024-01-24 | Disposition: A | Discharge status: Home / Self Care | Attending: Gastroenterology | Admitting: Gastroenterology

## 2024-01-24 DIAGNOSIS — R197 Diarrhea, unspecified: Secondary | ICD-10-CM | POA: Diagnosis present

## 2024-01-24 DIAGNOSIS — Z538 Procedure and treatment not carried out for other reasons: Secondary | ICD-10-CM | POA: Insufficient documentation

## 2024-01-24 SURGERY — SIGMOIDOSCOPY, FLEXIBLE
Anesthesia: General

## 2024-01-24 NOTE — Progress Notes (Signed)
 Patient came in this morning for scheduled Flexible Sigmoidoscopy.  Per Dr Therisa, since she has not submitted the ordered stool specimens, he is going to reschedule today's planned procedure. Once the results of the stool studies are available, he will contact the patient with the results and next step.  Patient stated she has been out of town and unable to collect and submit stool, but will do so asap.  The patient agrees with the plan to have the results of the stool studies prior to scheduling the Flexible Sigmoidoscopy.  I will make the office aware and she will be rescheduled when appropriate.

## 2024-02-20 ENCOUNTER — Encounter: Admission: RE | Disposition: A | Payer: Self-pay | Source: Home / Self Care | Attending: Gastroenterology

## 2024-02-20 ENCOUNTER — Ambulatory Visit
Admission: RE | Admit: 2024-02-20 | Discharge: 2024-02-20 | Disposition: A | Discharge status: Home / Self Care | Attending: Gastroenterology | Admitting: Gastroenterology

## 2024-02-20 ENCOUNTER — Encounter: Payer: Self-pay | Admitting: Gastroenterology

## 2024-02-20 ENCOUNTER — Encounter

## 2024-02-20 ENCOUNTER — Ambulatory Visit

## 2024-02-20 ENCOUNTER — Other Ambulatory Visit: Payer: Self-pay

## 2024-02-20 DIAGNOSIS — Z825 Family history of asthma and other chronic lower respiratory diseases: Secondary | ICD-10-CM | POA: Diagnosis not present

## 2024-02-20 DIAGNOSIS — J45909 Unspecified asthma, uncomplicated: Secondary | ICD-10-CM | POA: Insufficient documentation

## 2024-02-20 DIAGNOSIS — K52832 Lymphocytic colitis: Secondary | ICD-10-CM | POA: Insufficient documentation

## 2024-02-20 DIAGNOSIS — K219 Gastro-esophageal reflux disease without esophagitis: Secondary | ICD-10-CM | POA: Diagnosis not present

## 2024-02-20 DIAGNOSIS — E039 Hypothyroidism, unspecified: Secondary | ICD-10-CM | POA: Diagnosis not present

## 2024-02-20 DIAGNOSIS — R197 Diarrhea, unspecified: Secondary | ICD-10-CM | POA: Diagnosis present

## 2024-02-20 DIAGNOSIS — Z79899 Other long term (current) drug therapy: Secondary | ICD-10-CM | POA: Insufficient documentation

## 2024-02-20 SURGERY — SIGMOIDOSCOPY, FLEXIBLE
Anesthesia: General

## 2024-02-20 MED ORDER — SODIUM CHLORIDE 0.9 % IV SOLN: INTRAVENOUS | Status: DC

## 2024-02-20 MED ORDER — PROPOFOL 500 MG/50ML IV EMUL
INTRAVENOUS | Status: DC | PRN
Start: 2024-02-20 — End: 2024-02-20
  Administered 2024-02-20 (×3): 50 mg via INTRAVENOUS

## 2024-02-20 NOTE — Anesthesia Preprocedure Evaluation (Signed)
 Anesthesia Evaluation  Patient identified by MRN, date of birth, ID band Patient awake    Reviewed: Allergy  & Precautions, NPO status , Patient's Chart, lab work & pertinent test results  History of Anesthesia Complications Negative for: history of anesthetic complications  Airway Mallampati: III  TM Distance: <3 FB Neck ROM: full    Dental  (+) Chipped   Pulmonary neg shortness of breath, asthma    Pulmonary exam normal        Cardiovascular Exercise Tolerance: Good (-) angina negative cardio ROS Normal cardiovascular exam     Neuro/Psych  Headaches  negative psych ROS   GI/Hepatic Neg liver ROS,GERD  Controlled,,  Endo/Other  Hypothyroidism    Renal/GU negative Renal ROS  negative genitourinary   Musculoskeletal   Abdominal   Peds  Hematology negative hematology ROS (+)   Anesthesia Other Findings Past Medical History: No date: Allergy  No date: Anxiety No date: Asthma No date: Cancer [facility])     Comment:  skin ca No date: Depression No date: Insomnia No date: Migraines No date: Thyroid disease  Past Surgical History: No date: ABDOMINAL HYSTERECTOMY No date: CESAREAN SECTION 07/03/2023: COLONOSCOPY; N/A     Comment:  Procedure: COLONOSCOPY;  Surgeon: Therisa Bi, MD;                Location: [facility] ENDOSCOPY;  Service: Gastroenterology;                Laterality: N/A; 01/24/2024: FLEXIBLE SIGMOIDOSCOPY; N/A     Comment:  Procedure: SIGMOIDOSCOPY, FLEXIBLE;  Surgeon: Therisa Bi, MD;  Location: [facility] ENDOSCOPY;  Service:               Gastroenterology;  Laterality: N/A;  **NO SEDATION** NO               ANESTHESIA No date: LAPAROSCOPY No date: NASAL SINUS SURGERY No date: THYROIDECTOMY  BMI    Body Mass Index: 38.25 kg/m      Reproductive/Obstetrics negative OB ROS                              Anesthesia Physical Anesthesia Plan  ASA: 2  Anesthesia Plan:  General   Post-op Pain Management:    Induction: Intravenous  PONV Risk Score and Plan: Propofol  infusion and TIVA  Airway Management Planned: Natural Airway and Nasal Cannula  Additional Equipment:   Intra-op Plan:   Post-operative Plan:   Informed Consent: I have reviewed the patients History and Physical, chart, labs and discussed the procedure including the risks, benefits and alternatives for the proposed anesthesia with the patient or authorized representative who has indicated his/her understanding and acceptance.     Dental Advisory Given  Plan Discussed with: Anesthesiologist, CRNA and Surgeon  Anesthesia Plan Comments: (Patient consented for risks of anesthesia including but not limited to:  - adverse reactions to medications - risk of airway placement if required - damage to eyes, teeth, lips or other oral mucosa - nerve damage due to positioning  - sore throat or hoarseness - Damage to heart, brain, nerves, lungs, other parts of body or loss of life  Patient voiced understanding and assent.)        Anesthesia Quick Evaluation

## 2024-02-20 NOTE — Anesthesia Postprocedure Evaluation (Signed)
 Anesthesia Post Note  Patient: Erin Cervantes  Procedure(s) Performed: KINGSTON SIDE  Patient location during evaluation: Endoscopy Anesthesia Type: General Level of consciousness: awake and alert Pain management: pain level controlled Vital Signs Assessment: post-procedure vital signs reviewed and stable Respiratory status: spontaneous breathing, nonlabored ventilation and respiratory function stable Cardiovascular status: blood pressure returned to baseline and stable Postop Assessment: no apparent nausea or vomiting Anesthetic complications: no   There were no known notable events for this encounter.   Last Vitals:  Vitals:   02/20/24 1051 02/20/24 1124  BP: (!) 140/90 122/81  Pulse: 70 78  Resp: 14 17  Temp: (!) 35.9 C (!) 35.8 C  SpO2: 98% 100%    Last Pain:  Vitals:   02/20/24 1124  TempSrc: Temporal  PainSc: 0-No pain                 Fairy POUR Carren Blakley

## 2024-02-20 NOTE — Transfer of Care (Signed)
 Immediate Anesthesia Transfer of Care Note  Patient: Erin Cervantes  Procedure(s) Performed: KINGSTON SIDE  Patient Location: Endoscopy Unit  Anesthesia Type:General  Level of Consciousness: awake  Airway & Oxygen Therapy: Patient Spontanous Breathing  Post-op Assessment: Report given to RN and Post -op Vital signs reviewed and stable  Post vital signs: Reviewed and stable  Last Vitals:  Vitals Value Taken Time  BP    Temp    Pulse    Resp    SpO2      Last Pain:  Vitals:   02/20/24 1051  TempSrc: Temporal  PainSc: 0-No pain         Complications: There were no known notable events for this encounter.

## 2024-02-20 NOTE — H&P (Signed)
 Ruel Kung , MD [address], [place], [place], [address] Phone: [phone] Fax: [phone]  Primary Care Physician:  Bernardo Fend, DO   Pre-Procedure History & Physical: HPI:  Erin Cervantes is a 43 y.o. female is here for a flexible sigmoidoscopy .    Past Medical History:  Diagnosis Date   Allergy     Anxiety    Asthma    Cancer (HCC)    skin ca   Depression    Insomnia    Migraines    Thyroid disease     Past Surgical History:  Procedure Laterality Date   ABDOMINAL HYSTERECTOMY     CESAREAN SECTION     COLONOSCOPY N/A 07/03/2023   Procedure: COLONOSCOPY;  Surgeon: Kung Ruel, MD;  Location: [facility] ENDOSCOPY;  Service: Gastroenterology;  Laterality: N/A;   FLEXIBLE SIGMOIDOSCOPY N/A 01/24/2024   Procedure: KINGSTON SIDE;  Surgeon: Kung Ruel, MD;  Location: [facility] ENDOSCOPY;  Service: Gastroenterology;  Laterality: N/A;  **NO SEDATION** NO ANESTHESIA   LAPAROSCOPY     NASAL SINUS SURGERY     THYROIDECTOMY      Prior to Admission medications   Medication Sig Start Date End Date Taking? Authorizing Provider  AJOVY 225 MG/1.5ML SOAJ Inject 225 mg into the skin. Patient not taking: Reported on 12/24/2023 12/20/23   [provider]  albuterol (VENTOLIN HFA) 108 (90 Base) MCG/ACT inhaler Inhale 2 puffs into the lungs every 6 (six) hours as needed. 07/18/09   [provider]  calcitRIOL (ROCALTROL) 0.25 MCG capsule Take 0.5 mcg by mouth daily.    [provider]  citalopram (CELEXA) 40 MG tablet Take 40 mg by mouth daily.    [provider]  clonazePAM (KLONOPIN) 0.5 MG tablet Take 0.5 mg by mouth 3 (three) times daily.    [provider]  fexofenadine (ALLEGRA) 180 MG tablet Take 180 mg by mouth daily.    [provider]  levothyroxine (SYNTHROID) 200 MCG tablet Take  1 tablet (200 mcg total) by mouth daily. 12/24/23   Bernardo Fend, DO  predniSONE  (DELTASONE ) 10 MG tablet Take 1 tablet (10 mg total) by mouth daily. 6,5,4,3,2,1 six day taper Patient not taking: Reported on 12/24/2023 12/08/23   Charlene Debby BROCKS, PA-C  traZODone (DESYREL) 100 MG tablet Take 100 mg by mouth at bedtime.    [provider]  UBRELVY 100 MG TABS Take by mouth.    [provider]    Allergies as of 02/05/2024 - Review Complete 12/24/2023  Allergen Reaction Noted   Topiramate Anaphylaxis and Swelling 12/08/2023   Effexor xr [venlafaxine hcl] Other (See Comments) 11/19/2023   Meperidine Hives and Other (See Comments) 09/01/2012   Penicillins Nausea Only and Other (See Comments) 09/01/2012   Sulfa antibiotics Other (See Comments) 09/01/2012    Family History  Problem Relation Age of Onset   Thyroid disease Mother    Bipolar disorder Daughter    Asthma Son    Breast cancer Maternal Aunt        4 mat aunts   Breast cancer Paternal Aunt    Breast cancer Paternal Grandmother     Social History   Socioeconomic History   Marital status: Married    Spouse name: Not on file   Number of children: Not on file   Years of education: Not on file   Highest education level: Associate degree: occupational, scientist, product/process development, or vocational program  Occupational History   Not on file  Tobacco Use  Smoking status: Never   Smokeless tobacco: Never  Vaping Use   Vaping status: Never Used  Substance and Sexual Activity   Alcohol use: Yes    Alcohol/week: 7.0 standard drinks of alcohol    Types: 7 Shots of liquor per week    Comment: daily 2 margs   Drug use: Never   Sexual activity: Yes  Other Topics Concern   Not on file  Social History Narrative   Not on file   Social Drivers of Health   Financial Resource Strain: Low Risk  (01/16/2024)   Received from [facility]   Overall Financial Resource Strain (CARDIA)    Difficulty of Paying Living  Expenses: Not hard at all  Food Insecurity: No Food Insecurity (01/16/2024)   Received from [facility]   Hunger Vital Sign    Within the past 12 months, you worried that your food would run out before you got the money to buy more.: Never true    Within the past 12 months, the food you bought just didn't last and you didn't have money to get more.: Never true  Transportation Needs: No Transportation Needs (01/16/2024)   Received from [facility] - Transportation    In the past 12 months, has lack of transportation kept you from medical appointments or from getting medications?: No    Lack of Transportation (Non-Medical): No  Physical Activity: Insufficiently Active (11/18/2023)   Exercise Vital Sign    Days of Exercise per Week: 5 days    Minutes of Exercise per Session: 20 min  Stress: Stress Concern Present (11/18/2023)   [employer] of Occupational Health - Occupational Stress Questionnaire    Feeling of Stress: To some extent  Social Connections: Moderately Isolated (11/18/2023)   Social Connection and Isolation Panel    Frequency of Communication with Friends and Family: Three times a week    Frequency of Social Gatherings with Friends and Family: Once a week    Attends Religious Services: Never    Database Administrator or Organizations: No    Attends Engineer, Structural: Not on file    Marital Status: Married  Catering Manager Violence: Not on file    Review of Systems: See HPI, otherwise negative ROS  Physical Exam: There were no vitals taken for this visit. General:   Alert,  pleasant and cooperative in NAD Head:  Normocephalic and atraumatic. Neck:  Supple; no masses or thyromegaly. Lungs:  Clear throughout to auscultation, normal respiratory effort.    Heart:  +S1, +S2, Regular rate and rhythm, No edema. Abdomen:  Soft, nontender and nondistended. Normal bowel sounds, without guarding, and without rebound.    Neurologic:  Alert and  oriented x4;  grossly normal neurologically.  Impression/Plan: Erin Cervantes is here for a flexible sigmoidoscopy to evaluate for diarrhea Risks, benefits, limitations, and alternatives regarding  colonoscopy have been reviewed with the patient.  Questions have been answered.  All parties agreeable.   Ruel Kung, MD  02/20/2024, 10:27 AM

## 2024-02-20 NOTE — Op Note (Signed)
 [facility] Gastroenterology Patient Name: Erin Cervantes Procedure Date: 02/20/2024 11:10 AM MRN: [id number] Account #: [id number] Date of Birth: October 30, 1980 Admit Type: Outpatient Age: 43 Room: [facility] ENDO ROOM 2 Gender: Female Note Status: Finalized Instrument Name: Colon Scope [phone] Procedure:             Flexible Sigmoidoscopy Indications:           Diarrhea Providers:             Ruel Kung MD, MD Medicines:             Monitored Anesthesia Care Complications:         No immediate complications. Procedure:             Pre-Anesthesia Assessment:                        - Prior to the procedure, a History and Physical was                         performed, and patient medications, allergies and                         sensitivities were reviewed. The patient's tolerance                         of previous anesthesia was reviewed.                        - The risks and benefits of the procedure and the                         sedation options and risks were discussed with the                         patient. All questions were answered and informed                         consent was obtained.                        - ASA Grade Assessment: II - A patient with mild                         systemic disease.                        After obtaining informed consent, the scope was passed                         under direct vision. The was introduced through the                         anus and advanced to the the left transverse colon.                         The flexible sigmoidoscopy was accomplished with ease.                         The patient tolerated the procedure well. The quality  of the bowel preparation was adequate. Findings:      The colon (entire examined portion) appeared normal. Biopsies were taken       with a cold forceps for histology. Impression:            - The entire examined colon is normal.  Biopsied. Recommendation:        - Discharge patient to home (with escort).                        - Resume previous diet. Procedure Code(s):     --- Professional ---                        [phone], Sigmoidoscopy, flexible; with biopsy, single or                         multiple Diagnosis Code(s):     --- Professional ---                        R19.7, Diarrhea, unspecified CPT copyright 2022 American Medical Association. All rights reserved. The codes documented in this report are preliminary and upon coder review may  be revised to meet current compliance requirements. Ruel Kung, MD Ruel Kung MD, MD 02/20/2024 11:24:14 AM This report has been signed electronically. Number of Addenda: 0 Note Initiated On: 02/20/2024 11:10 AM Total Procedure Duration: 0 hours 3 minutes 10 seconds  Estimated Blood Loss:  Estimated blood loss: none.      [facility]

## 2024-02-21 LAB — SURGICAL PATHOLOGY

## 2024-04-24 ENCOUNTER — Encounter: Payer: Self-pay | Admitting: Family Medicine

## 2024-04-24 ENCOUNTER — Ambulatory Visit: Payer: Self-pay

## 2024-04-24 ENCOUNTER — Ambulatory Visit (INDEPENDENT_AMBULATORY_CARE_PROVIDER_SITE_OTHER): Admitting: Family Medicine

## 2024-04-24 VITALS — BP 122/80 | HR 98 | Temp 98.2°F | Resp 16 | Ht 66.0 in | Wt 247.3 lb

## 2024-04-24 DIAGNOSIS — J069 Acute upper respiratory infection, unspecified: Secondary | ICD-10-CM

## 2024-04-24 DIAGNOSIS — J4521 Mild intermittent asthma with (acute) exacerbation: Secondary | ICD-10-CM

## 2024-04-24 LAB — POC COVID19/FLU A&B COMBO
Covid Antigen, POC: NEGATIVE
Influenza A Antigen, POC: NEGATIVE
Influenza B Antigen, POC: NEGATIVE

## 2024-04-24 MED ORDER — AIRSUPRA 90-80 MCG/ACT IN AERO: 2.0000 | INHALATION_SPRAY | Freq: Four times a day (QID) | RESPIRATORY_TRACT | 2 refills | Status: AC

## 2024-04-24 MED ORDER — PREDNISONE 10 MG PO TABS: 10.0000 mg | ORAL_TABLET | Freq: Two times a day (BID) | ORAL | 0 refills | Status: AC

## 2024-04-24 MED ORDER — BENZONATATE 100 MG PO CAPS: 100.0000 mg | ORAL_CAPSULE | Freq: Three times a day (TID) | ORAL | 0 refills | Status: AC | PRN

## 2024-04-24 NOTE — Telephone Encounter (Addendum)
 FYI Only or Action Required?: FYI only for provider: appointment scheduled on 1/23.  Patient was last seen in primary care on 12/24/2023 by Bernardo Fend, DO.  Called Nurse Triage reporting Shortness of Breath.  Symptoms began yesterday.  Interventions attempted: Prescription medications: albuterol inhaler 2x yesterday, had pt use inhaler on phone and improving and Rest, hydration, or home remedies.  Symptoms are: gradually worsening.  Triage Disposition: See HCP Within 4 Hours (Or PCP Triage)  Patient/caregiver understands and will follow disposition?: Yes      Call disconnected from both [facility] and pt during transfer. Called pt back.  Reason for Disposition  [1] Continuous (nonstop) coughing AND [2] keeps from working or sleeping AND [3] not improved after 2 or 3 inhaler or nebulizer treatments given 20 minutes apart  Answer Assessment - Initial Assessment Questions This RN recommended pt be examined in next 4 hours, scheduled for today with PCP office. Pt accepted offer for call back within 30 min to ensure improvement with inhaler - this RN called pt back, pt confirms she is feeling better with inhaler. Advised call back or seek immediate care if new or worsening symptoms. Advised can use inhaler 2-3x 20 min apart to see if helps, if not immediately better after 3rd then go to ED. Pt verbalized understanding.     1. RESPIRATORY STATUS: Describe your breathing? (e.g., wheezing, shortness of breath, unable to speak, severe coughing)      More SOB than usual  2. ONSET: When did this asthma attack begin?      Yesterday  3. TRIGGER: What do you think triggered this attack? (e.g., URI, exposure to pollen or other allergen, tobacco smoke)      illness  5. SEVERITY: How bad is this attack?      Moderate but improving with meds  6. ASTHMA MEDICINES:  What treatments have you tried?      Albuterol inhaler twice yesterday, not used yet today  7. INHALED QUICK-RELIEF  TREATMENTS FOR THIS ATTACK: What treatments have you given yourself so far? and How many and how often? If using an inhaler, ask, How many puffs? Note: Routine treatments are 2 puffs every 4 hours as needed. Rescue treatments are 4 puffs repeated every 20 minutes, up to three times as needed.      Advised use rescue inhaler every 20 min 2-3x and if no improvement then go to ED Pt used inhaler while on phone with nurse, pt feeling improvement  8. OTHER SYMPTOMS: Do you have any other symptoms? (e.g., chest pain, coughing up yellow sputum, fever, runny nose)     Coughing fits more SOB Labored breathing sitting here right now - improved after using inhaler on phone since had not used yet this morning Sometimes heart racing Wheezing Fever, unsure temp, chills Coughing up sputum, not clear but not yellow Confirms comfortable with breathing right now  Denies: Struggling to breathe Disorientation Dizziness Chest pain Loud wheezing  Protocols used: Asthma Attack-A-AH

## 2024-04-24 NOTE — Progress Notes (Signed)
 Name: Erin Cervantes   MRN: [id number]    DOB: [birth year]   Date:04/24/2024       Progress Note  Subjective  Chief Complaint  Chief Complaint  Patient presents with   Nasal Congestion   Cough   Shortness of Breath    Discussed the use of AI scribe software for clinical note transcription with the patient, who gave verbal consent to proceed.  History of Present Illness Erin Cervantes is a 44 year old female with mild intermittent asthma who presents with shortness of breath and cough.  She has been experiencing significant coughing and loss of voice since yesterday, with symptoms being described as 'super draining' and intermittent throughout the day.  She has a history of mild intermittent asthma and reports increased coughing and shortness of breath. She uses Ventolin as a rescue inhaler and does not have a controller medication. She has an old prescription for prednisone  but has not used it recently.  She woke up with a headache and body aches, which were alleviated with Tylenol. She has a runny nose. Tessalon  Perles have been used in the past for cough suppression and are effective without causing drowsiness.  She works from home and states that her work is not affected by external conditions as long as there is power. She has a history of migraines, which can be triggered by illness and poor sleep.  She has been swabbed for COVID-19 and flu, both of which returned negative results.    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    Physical Exam CONSTITUTIONAL: Patient appears well-developed and well-nourished. No distress. HEENT: Head atraumatic, normocephalic, neck supple. Throat slightly red, no major abnormalities. CARDIOVASCULAR: Normal rate, regular rhythm and normal heart sounds. No murmur heard. No BLE edema. PULMONARY: Effort normal and breath sounds normal. No respiratory distress. Lungs clear to auscultation. ABDOMINAL: There is no tenderness or distention. MUSCULOSKELETAL: Normal gait. Without gross motor or sensory deficit. PSYCHIATRIC: Patient has a normal mood and affect. Behavior is normal. Judgment and thought content normal.  Recent Results (from the past 2160 hours)  Surgical pathology     Status: None   Collection Time: 02/20/24 12:00 AM  Result Value Ref Range   SURGICAL PATHOLOGY      SURGICAL PATHOLOGY [facility] [address] [place], [place]  [address] Telephone [phone] or [phone] Fax [phone]  REPORT OF SURGICAL PATHOLOGY   Accession #: [id number] Patient Name: TABIA, LANDOWSKI Visit # : [id number]  MRN: [id number] Physician: Therisa Bi DOB/Age [birth year] (Age: 53) Gender: F Collected Date: 02/20/2024 Received Date: 02/20/2024  FINAL DIAGNOSIS       1. Colon, biopsy, random, cbx :       - LYMPHOCYTIC COLITIS.      - NEGATIVE  FOR DYSPLASIA AND MALIGNANCY.       DATE SIGNED OUT: 02/21/2024 ELECTRONIC SIGNATURE : Janel Md, Rexene , Pathologist, Electronic Signature  MICROSCOPIC DESCRIPTION  CASE COMMENTS STAINS USED IN DIAGNOSIS: H&E    CLINICAL HISTORY  SPECIMEN(S) OBTAINED 1. Colon, biopsy, Random, Cbx  SPECIMEN COMMENTS: 1. For diarrhea SPECIMEN CLINICAL INFORMATION: 1. Chronic diarrhea, normal flex    Gross Description 1. Received in formalin are tan, soft ti ssue fragments that are submitted in toto.  Number: 8  Size:  0.5 cm, 1 block. mb 02-20-24        Report signed out from the following location(s) [place] [address] [place], [place] [address] CLIA #: [id number]  [facility] [address] [place], [place] [address] CLIA #: [id number]      Assessment & Plan Mild intermittent asthma with acute exacerbation Acute exacerbation likely due to viral illness. Discussed asthma management guidelines, emphasizing inhalers with corticosteroids over albuterol alone. Prednisone  considered if symptoms persist. - Prescribed Air Supra inhaler with albuterol and budesonide, 3 squirts every 4 hours as needed. - Provided voucher for [employer] for zero copay. - Instructed to rinse mouth after using Air Supra. - Prescribed prednisone  as backup if symptoms do not improve with Air Supra.  Viral upper respiratory infection Symptoms consistent with viral etiology, low likelihood of bacterial infection. Discussed hydration, nutrition, and over-the-counter medications for symptom relief. - Recommended over-the-counter medications such as DayQuil or Nyquil for symptom relief. - Prescribed Tessalon  Perles for cough suppression. - Advised on hydration and nutrition, including intake of zinc and vitamin C. - Recommended herbal tea for throat comfort.               [1]  Current Outpatient Medications:    albuterol (VENTOLIN HFA) 108 (90 Base)  MCG/ACT inhaler, Inhale 2 puffs into the lungs every 6 (six) hours as needed., Disp: , Rfl:    calcitRIOL (ROCALTROL) 0.25 MCG capsule, Take 0.5 mcg by mouth daily., Disp: , Rfl:    citalopram (CELEXA) 40 MG tablet, Take 40 mg by mouth daily., Disp: , Rfl:    clonazePAM (KLONOPIN) 0.5 MG tablet, Take 0.5 mg by mouth 3 (three) times daily., Disp: , Rfl:    fexofenadine (ALLEGRA) 180 MG tablet, Take 180 mg by mouth daily., Disp: , Rfl:    levothyroxine (SYNTHROID) 200 MCG tablet, Take 1 tablet (200 mcg total) by mouth daily., Disp: , Rfl:    traZODone (DESYREL) 100 MG tablet, Take 100 mg by mouth at bedtime., Disp: , Rfl:    UBRELVY 100 MG TABS, Take by mouth., Disp: , Rfl:    AJOVY 225 MG/1.5ML SOAJ, Inject 225 mg into the skin. (Patient not taking: Reported on 04/24/2024), Disp: , Rfl:    predniSONE  (DELTASONE ) 10 MG tablet, Take 1 tablet (10 mg total) by mouth daily. 6,5,4,3,2,1 six day taper (Patient not taking: Reported on 04/24/2024), Disp: 21 tablet, Rfl: 0 [2]  Allergies Allergen Reactions   Topiramate Anaphylaxis and Swelling    Angioedema  with additional serum sickness features  ER visit 12/2023, 4 hours after a dose, had taken for years for migraines but had been off ~ 2 months at the time of this dose   Effexor Xr [Venlafaxine Hcl] Other (See Comments)    Lightheadedness, syncope    Meperidine Hives and Other (See Comments)    hives   Penicillins Nausea Only and Other (See Comments)    hives   Sulfa Antibiotics Other (See Comments)    hives
